# Patient Record
Sex: Female | Born: 1945
Health system: Southern US, Community
[De-identification: ages and names within clinical notes are randomized; demographics above are authoritative.]

## PROBLEM LIST (undated history)

## (undated) DIAGNOSIS — E785 Hyperlipidemia, unspecified: Secondary | ICD-10-CM

## (undated) DIAGNOSIS — K219 Gastro-esophageal reflux disease without esophagitis: Secondary | ICD-10-CM

## (undated) DIAGNOSIS — M1990 Unspecified osteoarthritis, unspecified site: Secondary | ICD-10-CM

## (undated) DIAGNOSIS — S83209A Unspecified tear of unspecified meniscus, current injury, unspecified knee, initial encounter: Secondary | ICD-10-CM

## (undated) DIAGNOSIS — R011 Cardiac murmur, unspecified: Secondary | ICD-10-CM

## (undated) DIAGNOSIS — I10 Essential (primary) hypertension: Secondary | ICD-10-CM

## (undated) DIAGNOSIS — K5792 Diverticulitis of intestine, part unspecified, without perforation or abscess without bleeding: Secondary | ICD-10-CM

## (undated) HISTORY — DX: Essential (primary) hypertension: I10

## (undated) HISTORY — DX: Gastro-esophageal reflux disease without esophagitis: K21.9

## (undated) HISTORY — DX: Diverticulitis of intestine, part unspecified, without perforation or abscess without bleeding: K57.92

## (undated) HISTORY — DX: Hyperlipidemia, unspecified: E78.5

---
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
## 2009-07-16 ENCOUNTER — Encounter: Admission: RE | Admit: 2009-07-16 | Discharge: 2009-07-16 | Payer: Self-pay | Admitting: Obstetrics and Gynecology

## 2009-08-15 ENCOUNTER — Ambulatory Visit (HOSPITAL_COMMUNITY): Admission: RE | Admit: 2009-08-15 | Discharge: 2009-08-15 | Payer: Self-pay | Admitting: Internal Medicine

## 2009-08-16 ENCOUNTER — Encounter: Payer: Self-pay | Admitting: Cardiovascular Disease

## 2010-06-15 ENCOUNTER — Encounter: Payer: Self-pay | Admitting: Obstetrics and Gynecology

## 2010-06-24 NOTE — Letter (Signed)
Summary: [facility] Office Note  [facility] Office Note   Imported By: Roderic Ovens 08/26/2009 15:57:51  _____________________________________________________________________  External Attachment:    Type:   Image     Comment:   External Document

## 2010-07-28 ENCOUNTER — Other Ambulatory Visit (HOSPITAL_COMMUNITY): Payer: Self-pay | Admitting: Obstetrics and Gynecology

## 2010-07-28 ENCOUNTER — Other Ambulatory Visit: Payer: Self-pay | Admitting: Obstetrics and Gynecology

## 2010-07-28 DIAGNOSIS — Z1231 Encounter for screening mammogram for malignant neoplasm of breast: Secondary | ICD-10-CM

## 2010-08-05 ENCOUNTER — Ambulatory Visit (HOSPITAL_COMMUNITY)
Admission: RE | Admit: 2010-08-05 | Discharge: 2010-08-05 | Disposition: A | Discharge status: Home / Self Care | Payer: Medicare HMO | Source: Ambulatory Visit | Attending: Obstetrics and Gynecology | Admitting: Obstetrics and Gynecology

## 2010-08-05 DIAGNOSIS — Z1231 Encounter for screening mammogram for malignant neoplasm of breast: Secondary | ICD-10-CM | POA: Insufficient documentation

## 2011-08-17 ENCOUNTER — Encounter: Payer: Self-pay | Admitting: Internal Medicine

## 2011-08-17 ENCOUNTER — Other Ambulatory Visit (HOSPITAL_COMMUNITY): Payer: Self-pay | Admitting: Obstetrics and Gynecology

## 2011-08-17 DIAGNOSIS — Z1231 Encounter for screening mammogram for malignant neoplasm of breast: Secondary | ICD-10-CM

## 2011-09-21 ENCOUNTER — Ambulatory Visit (AMBULATORY_SURGERY_CENTER): Payer: Medicare HMO | Admitting: *Deleted

## 2011-09-21 ENCOUNTER — Encounter: Payer: Self-pay | Admitting: Internal Medicine

## 2011-09-21 ENCOUNTER — Ambulatory Visit (HOSPITAL_COMMUNITY)
Admission: RE | Admit: 2011-09-21 | Discharge: 2011-09-21 | Disposition: A | Discharge status: Home / Self Care | Payer: Medicare HMO | Source: Ambulatory Visit | Attending: Obstetrics and Gynecology | Admitting: Obstetrics and Gynecology

## 2011-09-21 VITALS — Ht 67.0 in | Wt 142.0 lb

## 2011-09-21 DIAGNOSIS — Z1211 Encounter for screening for malignant neoplasm of colon: Secondary | ICD-10-CM

## 2011-09-21 DIAGNOSIS — Z1231 Encounter for screening mammogram for malignant neoplasm of breast: Secondary | ICD-10-CM | POA: Insufficient documentation

## 2011-09-21 MED ORDER — PEG-KCL-NACL-NASULF-NA ASC-C 100 G PO SOLR: ORAL | Status: DC

## 2011-10-07 ENCOUNTER — Encounter: Payer: Self-pay | Admitting: Internal Medicine

## 2011-10-07 ENCOUNTER — Ambulatory Visit (AMBULATORY_SURGERY_CENTER): Payer: Medicare HMO | Admitting: Internal Medicine

## 2011-10-07 VITALS — BP 131/57 | HR 68 | Temp 96.4°F | Resp 18 | Ht 67.0 in | Wt 142.0 lb

## 2011-10-07 DIAGNOSIS — Z1211 Encounter for screening for malignant neoplasm of colon: Secondary | ICD-10-CM

## 2011-10-07 DIAGNOSIS — Z860100 Personal history of colon polyps, unspecified: Secondary | ICD-10-CM

## 2011-10-07 DIAGNOSIS — K5730 Diverticulosis of large intestine without perforation or abscess without bleeding: Secondary | ICD-10-CM

## 2011-10-07 MED ORDER — SODIUM CHLORIDE 0.9 % IV SOLN: 500.0000 mL | INTRAVENOUS | Status: DC

## 2011-10-07 NOTE — Op Note (Signed)
[facility] [address] [place], [place]  [address]  COLONOSCOPY PROCEDURE REPORT  PATIENT:  Candice Richardson, Candice Richardson  MR#:  [id number] BIRTHDATE:  17-Apr-1946, 66 yrs. old  GENDER:  female ENDOSCOPIST:  Wilhemina Bonito. Eda Keys, MD REF. BY:  Ivery Quale, M.D. PROCEDURE DATE:  10/07/2011 PROCEDURE:  Surveillance Colonoscopy ASA CLASS:  Class II INDICATIONS:  history of polyps, family history of inflammatory bowel disease, family history of colon cancer, surveillance and high-risk screening ; previous exams in [place]. Last 5 yrs ago. now due for follow up MEDICATIONS:   MAC sedation, administered by CRNA, propofol (Diprivan) 300 mg IV  DESCRIPTION OF PROCEDURE:   After the risks benefits and alternatives of the procedure were thoroughly explained, informed consent was obtained.  Digital rectal exam was performed and revealed no abnormalities.   The LB CF-[id number] endoscope was introduced through the anus and advanced to the cecum, which was identified by both the appendix and ileocecal valve, without limitations.  The quality of the prep was excellent, using MoviPrep.  The instrument was then slowly withdrawn as the colon was fully examined. <<PROCEDUREIMAGES>>  FINDINGS:  Moderate diverticulosis was found throughout the colon. Otherwise normal colonoscopy without  polyps, masses, vascular ectasias, or inflammatory changes.   Retroflexed views in the rectum revealed no abnormalities.    The time to cecum =  4:48 minutes. The scope was then withdrawn in  11  minutes from the cecum and the procedure completed.  COMPLICATIONS:  None  ENDOSCOPIC IMPRESSION: 1) Moderate diverticulosis throughout the colon 2) Otherwise normal colonoscopy  RECOMMENDATIONS: 1) Follow up colonoscopy in 5 years  ______________________________ Wilhemina Bonito. Eda Keys, MD  CC:  Jarome Matin, MD;  The Patient  n. eSIGNED:   Wilhemina Bonito. Eda Keys at 10/07/2011 11:39 AM  Jeris Penta,  [id number]

## 2011-10-07 NOTE — Patient Instructions (Signed)

## 2011-10-07 NOTE — Progress Notes (Signed)
Patient did not experience any of the following events: a burn prior to discharge; a fall within the facility; wrong site/side/patient/procedure/implant event; or a hospital transfer or hospital admission upon discharge from the facility. (G8907) Patient did not have preoperative order for IV antibiotic SSI prophylaxis. (G8918)  

## 2011-10-08 ENCOUNTER — Telehealth: Payer: Self-pay | Admitting: *Deleted

## 2011-10-08 NOTE — Telephone Encounter (Signed)
  Follow up Call-  Call back number 10/07/2011  Post procedure Call Back phone  # [phone]  Permission to leave phone message Yes     Patient questions:  Do you have a fever, pain , or abdominal swelling? no Pain Score  0 *  Have you tolerated food without any problems? yes  Have you been able to return to your normal activities? yes  Do you have any questions about your discharge instructions: Diet   no Medications  no Follow up visit  no  Do you have questions or concerns about your Care? no  Actions: * If pain score is 4 or above: No action needed, pain <4.

## 2012-08-18 ENCOUNTER — Other Ambulatory Visit (HOSPITAL_COMMUNITY): Payer: Self-pay | Admitting: Obstetrics and Gynecology

## 2012-08-18 DIAGNOSIS — M810 Age-related osteoporosis without current pathological fracture: Secondary | ICD-10-CM

## 2012-08-18 DIAGNOSIS — Z1231 Encounter for screening mammogram for malignant neoplasm of breast: Secondary | ICD-10-CM

## 2012-09-26 ENCOUNTER — Ambulatory Visit (HOSPITAL_COMMUNITY): Payer: Medicare HMO

## 2012-10-04 ENCOUNTER — Ambulatory Visit (HOSPITAL_COMMUNITY)
Admission: RE | Admit: 2012-10-04 | Discharge: 2012-10-04 | Disposition: A | Discharge status: Home / Self Care | Payer: Medicare Other | Source: Ambulatory Visit | Attending: Obstetrics and Gynecology | Admitting: Obstetrics and Gynecology

## 2012-10-04 DIAGNOSIS — Z13820 Encounter for screening for osteoporosis: Secondary | ICD-10-CM | POA: Insufficient documentation

## 2012-10-04 DIAGNOSIS — Z1231 Encounter for screening mammogram for malignant neoplasm of breast: Secondary | ICD-10-CM

## 2012-10-04 DIAGNOSIS — Z780 Asymptomatic menopausal state: Secondary | ICD-10-CM | POA: Insufficient documentation

## 2012-10-04 DIAGNOSIS — M810 Age-related osteoporosis without current pathological fracture: Secondary | ICD-10-CM

## 2012-11-18 ENCOUNTER — Encounter (INDEPENDENT_AMBULATORY_CARE_PROVIDER_SITE_OTHER): Payer: Medicare Other | Admitting: *Deleted

## 2012-11-18 DIAGNOSIS — M79609 Pain in unspecified limb: Secondary | ICD-10-CM

## 2012-11-21 ENCOUNTER — Encounter: Payer: Self-pay | Admitting: Family Medicine

## 2013-10-04 ENCOUNTER — Ambulatory Visit (INDEPENDENT_AMBULATORY_CARE_PROVIDER_SITE_OTHER): Payer: Medicare Other | Admitting: Cardiovascular Disease

## 2013-10-04 ENCOUNTER — Encounter: Payer: Self-pay | Admitting: Cardiovascular Disease

## 2013-10-04 VITALS — BP 124/80 | HR 73 | Ht 67.0 in | Wt 150.7 lb

## 2013-10-04 DIAGNOSIS — K219 Gastro-esophageal reflux disease without esophagitis: Secondary | ICD-10-CM

## 2013-10-04 DIAGNOSIS — R0789 Other chest pain: Secondary | ICD-10-CM

## 2013-10-04 DIAGNOSIS — E785 Hyperlipidemia, unspecified: Secondary | ICD-10-CM

## 2013-10-04 DIAGNOSIS — I10 Essential (primary) hypertension: Secondary | ICD-10-CM

## 2013-10-04 DIAGNOSIS — R079 Chest pain, unspecified: Secondary | ICD-10-CM

## 2013-10-04 NOTE — Patient Instructions (Signed)
Your physician has requested that you have an echocardiogram. Echocardiography is a painless test that uses sound waves to create images of your heart. It provides your doctor with information about the size and shape of your heart and how well your heart's chambers and valves are working. This procedure takes approximately one hour. There are no restrictions for this procedure.  Your physician has requested that you have en exercise stress myoview. For further information please visit [email or web address]. Please follow instruction sheet, as given.  These tests will be scheduled the week of May 26th Your physician recommends that you schedule a follow-up appointment in: 6 weeks.  Your physician has recommended you make the following change in your medication: take some aleve for your symptoms.

## 2013-10-05 ENCOUNTER — Encounter: Payer: Self-pay | Admitting: Cardiovascular Disease

## 2013-10-05 DIAGNOSIS — E785 Hyperlipidemia, unspecified: Secondary | ICD-10-CM | POA: Insufficient documentation

## 2013-10-05 DIAGNOSIS — I10 Essential (primary) hypertension: Secondary | ICD-10-CM | POA: Insufficient documentation

## 2013-10-05 DIAGNOSIS — K219 Gastro-esophageal reflux disease without esophagitis: Secondary | ICD-10-CM | POA: Insufficient documentation

## 2013-10-05 DIAGNOSIS — R0789 Other chest pain: Secondary | ICD-10-CM | POA: Insufficient documentation

## 2013-10-05 NOTE — Progress Notes (Signed)
Patient ID: Candice Richardson, female   DOB: [birth year], 68 y.o.   MRN: [id number]     PATIENT PROFILE: Candice Richardson is a 68 year old female, who is referred by Dr. Bevelyn Buckles, for evaluation of left-sided chest pain prior to her planned travel.   HPI:  Candice Richardson has  a history of hypertension, GERD, as well as hyperlipidemia.  Previously when she lived in [place] she was told of having a heart murmur approximately 15 years ago and did undergo frequent echo Doppler studies, as well as stress tests , when she lived near [place].  He does exercise regularly and often exercises on the elliptical.  She has noticed some left sided chest pain, which he feels is more positional and muscular.  She had seen Dr. Sharlett Iles.  She describes the discomfort at times as a "throbbing ache."  Sometimes, sitting makes it worse.  She denies a clear-cut exertional precipitation.  She will be going to [place] in several days for a week to babysit her grandchildren and then will be home for 4 days prior to going on a 2 week trip to [place].  She presents now for cardiology evaluation.   She has been on amlodipine 5 mg and Lopressor 50 mg daily for blood pressure control.  She has a history of GERD which has benefited from omeprazole.  She has not had any subsequent echo Doppler studies or stress test since she moved to the [place] area from [place] several years ago.  Past Medical History  Diagnosis Date  . GERD (gastroesophageal reflux disease)   . Hyperlipidemia   . Hypertension     Past Surgical History  Procedure Laterality Date  . Appendectomy  1947    18 months old    No Known Allergies  Current Outpatient Prescriptions  Medication Sig Dispense Refill  . amLODipine (NORVASC) 5 MG tablet Take 5 mg by mouth daily.      Marland Kitchen aspirin 81 MG tablet Take 81 mg by mouth daily.      . calcium carbonate (OS-CAL) 600 MG TABS Take 600 mg by mouth 2 (two) times daily with a meal.      .  fish oil-omega-3 fatty acids 1000 MG capsule Take 1 g by mouth daily.      . metoprolol (LOPRESSOR) 50 MG tablet Take 50 mg by mouth daily.      . Multiple Vitamins-Minerals (MULTIVITAMIN WITH MINERALS) tablet Take 1 tablet by mouth daily.      Marland Kitchen omeprazole (PRILOSEC) 20 MG capsule Take 20 mg by mouth daily.      . peg 3350 powder (MOVIPREP) SOLR MOVI PREP take as directed  1 kit  0  . PREMARIN vaginal cream Place 1 application vaginally Once a week.      . simvastatin (ZOCOR) 40 MG tablet Take 40 mg by mouth every evening.       No current facility-administered medications for this visit.    Socially, she was originally from [place].  She lives in [place] for approximately 10-15 years.  She is widowed.  She does travel.  She has 2 children and 4 grandchildren.  She does drink occasional wine.  There is no tobacco use  Family History  Problem Relation Age of Onset  . Heart disease Mother   . Stomach cancer Father   . Colon polyps Brother   . Colon cancer Cousin     ROS General: Negative; No fevers, chills, or night sweats HEENT: Negative; No changes  in vision or hearing, sinus congestion, difficulty swallowing Pulmonary: Negative; No cough, wheezing, shortness of breath, hemoptysis Cardiovascular: Positive for left-sided chest pain, no presyncope, syncope, palpatations GI: Positive for GERD No nausea, vomiting, diarrhea, or abdominal pain GU: Negative; No dysuria, hematuria, or difficulty voiding Musculoskeletal: Negative; no myalgias, joint pain, or weakness Hematologic/Oncologic: Negative; no easy bruising, bleeding Endocrine: Negative; no heat/cold intolerance; no diabetes Neuro: Negative; no changes in balance, headaches Skin: Negative; No rashes or skin lesions Psychiatric: Negative; No behavioral problems, depression Sleep: Negative; No daytime sleepiness, hypersomnolence, bruxism, restless legs, hypnogagnic hallucinations Other comprehensive 14 point system  review is negative  Physical Exam BP 124/80  Pulse 73  Ht $R'5\' 7"'Vx$  (1.702 m)  Wt 150 lb 11.2 oz (68.357 kg)  BMI 23.60 kg/m2 General: Alert, oriented, no distress.  Skin: normal turgor, no rashes, warm and dry HEENT: Normocephalic, atraumatic. Pupils equal round and reactive to light; sclera anicteric; extraocular muscles intact; Fundi no AV nicking, hemorrhages, or exudates. Nose without nasal septal hypertrophy Mouth/Parynx benign; Mallinpatti scale 2 Neck: No JVD, no carotid bruits; normal carotid upstroke Lungs: clear to ausculatation and percussion; no wheezing or rales Chest wall: mild tenderness to palpitation over the left costochondral region Heart: PMI not displaced, RRR, s1 s2 normal, 1/6 systolic murmur, no diastolic murmur, no rubs, gallops, thrills, or heaves Abdomen: soft, nontender; no hepatosplenomehaly, BS+; abdominal aorta nontender and not dilated by palpation. Back: no CVA tenderness Pulses 2+ Musculoskeletal: full range of motion, normal strength, no joint deformities Extremities: no clubbing cyanosis or edema, Homan's sign negative  Neurologic: grossly nonfocal; Cranial nerves grossly wnl Psychologic: Normal mood and affect   ECG (independently read by me): Normal sinus rhythm at 73 beats per minute.  No ectopy.  Normal intervals.  LABS:  BMET No results found for this basename: na, k, cl, co2, glucose, bun, creatinine, calcium, gfrnonaa, gfraa     Hepatic Function Panel  No results found for this basename: prot, albumin, ast, alt, alkphos, bilitot, bilidir, ibili     CBC No results found for this basename: wbc, rbc, hgb, hct, plt, mcv, mch, mchc, rdw, neutrabs, lymphsabs, monoabs, eosabs, basosabs     BNP No results found for this basename: probnp    Lipid Panel  No results found for this basename: chol, trig, hdl, cholhdl, vldl, ldlcalc      RADIOLOGY: No results found.   ASSESSMENT AND PLAN: Candice Richardson is a 68 year-old female who has  a history of hypertension, hyperlipidemia, with cholesterols as high as 240 in the past, as well as GERD.  She has been taking simvastatin 40 mg for her lipid abnormality.  She has been taking amlodipine 5 mg and metoprolol 50 mg for her blood pressure.  Presently, her blood pressure is stable.  She has experienced some left-sided chest discomfort.  This is not exertionally precipitated.  Recently she tried doing elliptical exercises to see if it made it worse.  She does have chest wall tenderness to palpation.  Her ECG is unremarkable.  I suspect this most likely is musculoskeletal chest discomfort, and I suggested that she take over-the-counter Aleve 2 tablets twice a day for the next 3-4 days and then one twice a day with food and that she will continue to take her omeprazole for GERD.  Since she was told remotely of having a leaky heart valve I am scheduling her for a followup echo Doppler study.  I will also schedule her for routine treadmill test for assurance to  make certain there is no significant exercise-induced EKG changes.  I'll see her back in followup and further recommendations will be made at that time.    Troy Sine, MD, [facility] 10/05/2013 5:50 PM

## 2013-10-06 ENCOUNTER — Other Ambulatory Visit: Payer: Self-pay | Admitting: *Deleted

## 2013-10-06 DIAGNOSIS — R079 Chest pain, unspecified: Secondary | ICD-10-CM

## 2013-10-09 ENCOUNTER — Telehealth (HOSPITAL_COMMUNITY): Payer: Self-pay | Admitting: *Deleted

## 2013-10-13 ENCOUNTER — Telehealth (HOSPITAL_COMMUNITY): Payer: Self-pay | Admitting: *Deleted

## 2013-10-17 ENCOUNTER — Telehealth (HOSPITAL_COMMUNITY): Payer: Self-pay

## 2013-10-18 ENCOUNTER — Ambulatory Visit (HOSPITAL_COMMUNITY)
Admission: RE | Admit: 2013-10-18 | Discharge: 2013-10-18 | Disposition: A | Discharge status: Home / Self Care | Payer: Medicare Other | Source: Ambulatory Visit | Attending: Internal Medicine | Admitting: Internal Medicine

## 2013-10-18 DIAGNOSIS — R079 Chest pain, unspecified: Secondary | ICD-10-CM | POA: Insufficient documentation

## 2013-10-19 ENCOUNTER — Encounter (HOSPITAL_COMMUNITY): Payer: Medicare Other

## 2013-10-19 ENCOUNTER — Ambulatory Visit (HOSPITAL_COMMUNITY)
Admission: RE | Admit: 2013-10-19 | Discharge: 2013-10-19 | Disposition: A | Discharge status: Home / Self Care | Payer: Medicare Other | Source: Ambulatory Visit | Attending: Cardiovascular Disease | Admitting: Cardiovascular Disease

## 2013-10-19 DIAGNOSIS — R079 Chest pain, unspecified: Secondary | ICD-10-CM | POA: Insufficient documentation

## 2013-10-19 DIAGNOSIS — I059 Rheumatic mitral valve disease, unspecified: Secondary | ICD-10-CM

## 2013-10-19 NOTE — Progress Notes (Signed)
2D Echo Performed 10/19/2013    Sible Straley, RCS  

## 2013-10-24 ENCOUNTER — Encounter: Payer: Self-pay | Admitting: *Deleted

## 2013-10-30 ENCOUNTER — Telehealth: Payer: Self-pay | Admitting: *Deleted

## 2013-10-30 ENCOUNTER — Other Ambulatory Visit: Payer: Self-pay | Admitting: *Deleted

## 2013-10-30 DIAGNOSIS — I10 Essential (primary) hypertension: Secondary | ICD-10-CM

## 2013-10-30 DIAGNOSIS — R9439 Abnormal result of other cardiovascular function study: Secondary | ICD-10-CM

## 2013-10-31 NOTE — Telephone Encounter (Signed)
Left message to return call in reference to her stress test.

## 2013-11-09 ENCOUNTER — Telehealth: Payer: Self-pay | Admitting: Cardiovascular Disease

## 2013-11-09 NOTE — Telephone Encounter (Signed)
Returning your call from 10-30-13.

## 2013-11-10 NOTE — Telephone Encounter (Signed)
Returned patient's call. Discussed her stress test.

## 2013-11-13 ENCOUNTER — Other Ambulatory Visit: Payer: Self-pay | Admitting: *Deleted

## 2013-11-13 DIAGNOSIS — I10 Essential (primary) hypertension: Secondary | ICD-10-CM

## 2013-11-13 DIAGNOSIS — R9439 Abnormal result of other cardiovascular function study: Secondary | ICD-10-CM

## 2013-11-14 ENCOUNTER — Other Ambulatory Visit (HOSPITAL_COMMUNITY): Payer: Self-pay | Admitting: Cardiovascular Disease

## 2013-11-14 ENCOUNTER — Ambulatory Visit (HOSPITAL_COMMUNITY)
Admission: RE | Admit: 2013-11-14 | Discharge: 2013-11-14 | Disposition: A | Discharge status: Home / Self Care | Payer: Medicare Other | Source: Ambulatory Visit | Attending: Internal Medicine | Admitting: Internal Medicine

## 2013-11-14 DIAGNOSIS — R079 Chest pain, unspecified: Secondary | ICD-10-CM | POA: Insufficient documentation

## 2013-11-14 DIAGNOSIS — Z8249 Family history of ischemic heart disease and other diseases of the circulatory system: Secondary | ICD-10-CM | POA: Insufficient documentation

## 2013-11-14 DIAGNOSIS — R0609 Other forms of dyspnea: Secondary | ICD-10-CM | POA: Insufficient documentation

## 2013-11-14 DIAGNOSIS — R002 Palpitations: Secondary | ICD-10-CM | POA: Insufficient documentation

## 2013-11-14 DIAGNOSIS — I10 Essential (primary) hypertension: Secondary | ICD-10-CM | POA: Insufficient documentation

## 2013-11-14 DIAGNOSIS — R0789 Other chest pain: Secondary | ICD-10-CM

## 2013-11-14 DIAGNOSIS — R0989 Other specified symptoms and signs involving the circulatory and respiratory systems: Secondary | ICD-10-CM | POA: Insufficient documentation

## 2013-11-14 MED ORDER — REGADENOSON 0.4 MG/5ML IV SOLN
0.4000 mg | Freq: Once | INTRAVENOUS | Status: AC
  Administered 2013-11-14: 0.4 mg via INTRAVENOUS

## 2013-11-14 MED ORDER — TECHNETIUM TC 99M SESTAMIBI GENERIC - CARDIOLITE
10.0000 | Freq: Once | INTRAVENOUS | Status: AC | PRN
  Administered 2013-11-14: 10 via INTRAVENOUS

## 2013-11-14 MED ORDER — TECHNETIUM TC 99M SESTAMIBI GENERIC - CARDIOLITE
30.0000 | Freq: Once | INTRAVENOUS | Status: AC | PRN
  Administered 2013-11-14: 30 via INTRAVENOUS

## 2013-11-14 NOTE — Procedures (Addendum)
[facility] [address] [place] [address] [phone]  Cardiology Nuclear Med Study  Candice Richardson is a 68 y.o. female     MRN : [id number]     DOB: 1945-06-03  Procedure Date: 11/14/2013  Nuclear Med Background Indication for Stress Test:  Evaluation for Ischemia History:  murmur;No prior respiratory history reported;No prior NUC MPI for comparison;ECHO-10/18/2013-ischemic w/ST changes Cardiac Risk Factors: Family History - CAD, Hypertension and Lipids  Symptoms:  Chest Pain and DOE   Nuclear Pre-Procedure Caffeine/Decaff Intake:  9:00pm NPO After: 7:00am   IV Site: R Forearm  IV 0.9% NS with Angio Cath:  22g  Chest Size (in):  n/a IV Started by: Candice OgrenAmanda Wease, RN  Height: 5\' 7"  (1.702 m)  Cup Size: A  BMI:  Body mass index is 23.49 kg/(m^2). Weight:  150 lb (68.04 kg)   Tech Comments:  n/a    Nuclear Med Study 1 or 2 day study: 1 day  Stress Test Type:  Stress  Order Authorizing Provider:  Nicki Guadalajarahomas [name], MD   Resting Radionuclide: Technetium 5752m Sestamibi  Resting Radionuclide Dose: 10.8 mCi   Stress Radionuclide:  Technetium 6252m Sestamibi  Stress Radionuclide Dose: 30.6 mCi           Stress Protocol Rest HR: 60 Stress HR: 78  Rest BP: 149/84 Stress BP: 152/86  Exercise Time (min): n/a METS: n/a   Predicted Max HR: 150 bpm % Max HR: 56.67 bpm Rate Pressure Product: [id number]  Dose of Adenosine (mg):  n/a Dose of Lexiscan: 0.4 mg  Dose of Atropine (mg): n/a Dose of Dobutamine: n/a mcg/kg/min (at max HR)  Stress Test Technologist: Esperanza Sheetserry-Marie Martin, CCT Nuclear Technologist: Koren Shiverobin Moffitt, CNMT   Rest Procedure:  Myocardial perfusion imaging was performed at rest 45 minutes following the intravenous administration of Technetium 3952m Sestamibi. Stress Procedure:  The patient received IV Lexiscan 0.4 mg over 15-seconds.  Technetium 7652m Sestamibi injected IV at 30-seconds.  There were no significant changes with  Lexiscan.  Quantitative spect images were obtained after a 45 minute delay.  Transient Ischemic Dilatation (Normal <1.22):  1.27 QGS EDV:  60 ml QGS ESV:  18 ml LV Ejection Fraction: 69%     Rest ECG: NSR - Normal EKG  Stress ECG: No significant change from baseline ECG  QPS Raw Data Images:  There is interference from nuclear activity from structures below the diaphragm. This does not affect the ability to read the study. Stress Images:  Normal homogeneous uptake in all areas of the myocardium. Rest Images:  Normal homogeneous uptake in all areas of the myocardium. Subtraction (SDS):  Normal  Impression Exercise Capacity:  Lexiscan with no exercise. BP Response:  Normal blood pressure response. Clinical Symptoms:  No significant symptoms noted. ECG Impression:  No significant ST segment change suggestive of ischemia. Comparison with Prior Nuclear Study: No previous nuclear study performed  Overall Impression:  Normal stress nuclear study.  LV Wall Motion:  NL LV Function, EF 69%; NL Wall Motion   Nicki GuadalajaraKELLY,THOMAS A, MD  11/14/2013 12:51 PM

## 2013-11-15 ENCOUNTER — Telehealth: Payer: Self-pay | Admitting: Cardiovascular Disease

## 2013-11-15 NOTE — Telephone Encounter (Signed)
Follow Up    Pt is calling following up test results from yesterday. Please call.

## 2013-11-15 NOTE — Telephone Encounter (Signed)
RN called and informed that preliminary report is that nuclear stress test was "normal" per overall impressions from test result in EPIC. Patient wishes to be called with final result prior to going out of town on Friday June 26

## 2013-11-16 NOTE — Telephone Encounter (Signed)
Patient notified per Nuclear Stress test - NORMAL.  Patient voiced understanding.

## 2013-11-16 NOTE — Telephone Encounter (Signed)
Pt called again today,she wants her stress test results from Tuesday.She would like her results before she goes out of town tomorrow please.

## 2013-11-22 NOTE — Telephone Encounter (Signed)
Encounter complete. 

## 2013-12-05 ENCOUNTER — Other Ambulatory Visit (HOSPITAL_COMMUNITY): Payer: Self-pay | Admitting: Internal Medicine

## 2013-12-05 DIAGNOSIS — Z1231 Encounter for screening mammogram for malignant neoplasm of breast: Secondary | ICD-10-CM

## 2013-12-11 ENCOUNTER — Encounter: Payer: Self-pay | Admitting: Cardiovascular Disease

## 2013-12-11 ENCOUNTER — Ambulatory Visit (INDEPENDENT_AMBULATORY_CARE_PROVIDER_SITE_OTHER): Payer: Medicare Other | Admitting: Cardiovascular Disease

## 2013-12-11 ENCOUNTER — Other Ambulatory Visit (HOSPITAL_COMMUNITY): Payer: Self-pay | Admitting: *Deleted

## 2013-12-11 VITALS — BP 146/78 | HR 62 | Ht 67.0 in | Wt 150.1 lb

## 2013-12-11 DIAGNOSIS — I340 Nonrheumatic mitral (valve) insufficiency: Secondary | ICD-10-CM

## 2013-12-11 DIAGNOSIS — I059 Rheumatic mitral valve disease, unspecified: Secondary | ICD-10-CM

## 2013-12-11 DIAGNOSIS — R0789 Other chest pain: Secondary | ICD-10-CM

## 2013-12-11 DIAGNOSIS — E785 Hyperlipidemia, unspecified: Secondary | ICD-10-CM

## 2013-12-11 DIAGNOSIS — I10 Essential (primary) hypertension: Secondary | ICD-10-CM

## 2013-12-11 DIAGNOSIS — K219 Gastro-esophageal reflux disease without esophagitis: Secondary | ICD-10-CM

## 2013-12-11 NOTE — Patient Instructions (Signed)
If you find your systolic (top number) is greater than 140 consistently you can increase the Amlodipine to 7.5mg  daily.  Your physician recommends that you schedule a follow-up appointment in: One year.

## 2013-12-17 ENCOUNTER — Encounter: Payer: Self-pay | Admitting: Cardiovascular Disease

## 2013-12-17 DIAGNOSIS — I340 Nonrheumatic mitral (valve) insufficiency: Secondary | ICD-10-CM | POA: Insufficient documentation

## 2013-12-17 NOTE — Progress Notes (Signed)
Patient ID: Candice Richardson, female   DOB: 1945-12-04, 68 y.o.   MRN: [id number]     HPI:  Candice Richardson is a 68 year old female, who I saw 2 months ago referred by Dr. Bevelyn Buckles, for evaluation of left-sided chest pain.   Candice Richardson has  a history of hypertension, GERD, as well as hyperlipidemia.  Previously when she lived in [place] she was told of having a heart murmur approximately 15 years ago and underwent frequent echo Doppler studies, as well as stress tests , when she lived near [place].  She does exercise regularly She has noticed some left sided chest pain, which he feels is more positional and muscular.  She had seen Dr. Sharlett Iles.  She described the discomfort at times as a "throbbing ache."  Sometimes, sitting makes it worse.  She denied a clear-cut exertional precipitation.  I saw her prior to her planned travel.  She has been on amlodipine 5 mg and Lopressor 50 mg daily for blood pressure control.  She has a history of GERD which has benefited from omeprazole.  She had not had any subsequent echo Doppler studies or stress test since she moved to the [place] area from [place] several years ago.  When I saw her, I did recommend over-the-counter Aleve 4 mg twice a day for 3-4 days and then one tablet twice a day.  This resolved her probable musculoskeletal chest pain.  She did undergo a routine treadmill test and reportedly had approximately 2 mm of horizontal ST depression that was completely asymptomatic at peak stress achieving a workload of 11 mets maximal heart rate of 136 beats per minute, representing 90% of predicted maximum.  Subsequently, she underwent a nuclear myocardial perfusion was entirely normal and showed normal LV function, normal wall motion and a post-rest ejection fraction of 69%.  There is normal.  Perfusion to all regions and no significant ECG changes were noted.uclear perfusion study, which was done on 11/14/2013.  An echo Doppler study  revealed an ejection fraction of 55-60% without wall motion abnormalities.  She had normal diastolic parameters. Her mitral valve was mildly thickened with mild regurgitation and there was mild TR.  There was mild pulmonary hypertension with estimated PA pressure 37.  Presently, she denies recurrent chest pain.  She denies shortness of breath.  She was concerned whether or not she should continue to take aspirin.  Past Medical History  Diagnosis Date  . GERD (gastroesophageal reflux disease)   . Hyperlipidemia   . Hypertension     Past Surgical History  Procedure Laterality Date  . Appendectomy  1947    18 months old    No Known Allergies  Current Outpatient Prescriptions  Medication Sig Dispense Refill  . amLODipine (NORVASC) 5 MG tablet Take 5 mg by mouth daily.      Marland Kitchen aspirin 81 MG tablet Take 81 mg by mouth daily.      . calcium carbonate (OS-CAL) 600 MG TABS Take 600 mg by mouth 2 (two) times daily with a meal.      . fish oil-omega-3 fatty acids 1000 MG capsule Take 1 g by mouth daily.      . metoprolol (LOPRESSOR) 50 MG tablet Take 50 mg by mouth daily.      . Multiple Vitamins-Minerals (MULTIVITAMIN WITH MINERALS) tablet Take 1 tablet by mouth daily.      Marland Kitchen omeprazole (PRILOSEC) 20 MG capsule Take 20 mg by mouth daily.      . peg  3350 powder (MOVIPREP) SOLR MOVI PREP take as directed  1 kit  0  . PREMARIN vaginal cream Place 1 application vaginally Once a week.      . simvastatin (ZOCOR) 40 MG tablet Take 40 mg by mouth every evening.       No current facility-administered medications for this visit.    Socially, she was originally from [place].  She lives in [place] for approximately 10-15 years.  She is widowed.  She does travel.  She has 2 children and 4 grandchildren.  She does drink occasional wine.  There is no tobacco use  Family History  Problem Relation Age of Onset  . Heart disease Mother   . Stomach cancer Father   . Colon polyps Brother   .  Colon cancer Cousin     ROS General: Negative; No fevers, chills, or night sweats HEENT: Negative; No changes in vision or hearing, sinus congestion, difficulty swallowing Pulmonary: Negative; No cough, wheezing, shortness of breath, hemoptysis Cardiovascular: Positive for left-sided chest pain, no presyncope, syncope, palpatations GI: Positive for GERD No nausea, vomiting, diarrhea, or abdominal pain GU: Negative; No dysuria, hematuria, or difficulty voiding Musculoskeletal: Negative; no myalgias, joint pain, or weakness Hematologic/Oncologic: Negative; no easy bruising, bleeding Endocrine: Negative; no heat/cold intolerance; no diabetes Neuro: Negative; no changes in balance, headaches Skin: Negative; No rashes or skin lesions Psychiatric: Negative; No behavioral problems, depression Sleep: Negative; No daytime sleepiness, hypersomnolence, bruxism, restless legs, hypnogagnic hallucinations Other comprehensive 14 point system review is negative  Physical Exam BP 146/78  Pulse 62  Ht $R'5\' 7"'TJ$  (1.702 m)  Wt 150 lb 1.6 oz (68.085 kg)  BMI 23.50 kg/m2 General: Alert, oriented, no distress.  Skin: normal turgor, no rashes, warm and dry HEENT: Normocephalic, atraumatic. Pupils equal round and reactive to light; sclera anicteric; extraocular muscles intact; Fundi no AV nicking, hemorrhages, or exudates. Nose without nasal septal hypertrophy Mouth/Parynx benign; Mallinpatti scale 2 Neck: No JVD, no carotid bruits; normal carotid upstroke Lungs: clear to ausculatation and percussion; no wheezing or rales Chest wall: Resolution of prior tenderness to palpitation over the left costochondral region Heart: PMI not displaced, RRR, s1 s2 normal, 1/6 systolic murmur, no diastolic murmur, no rubs, gallops, thrills, or heaves Abdomen: soft, nontender; no hepatosplenomehaly, BS+; abdominal aorta nontender and not dilated by palpation. Back: no CVA tenderness Pulses 2+ Musculoskeletal: full range of  motion, normal strength, no joint deformities Extremities: no clubbing cyanosis or edema, Homan's sign negative  Neurologic: grossly nonfocal; Cranial nerves grossly wnl Psychologic: Normal mood and affect   Prior ECG (independently read by me): Normal sinus rhythm at 73 beats per minute.  No ectopy.  Normal intervals.  LABS:  BMET No results found for this basename: na,  k,  cl,  co2,  glucose,  bun,  creatinine,  calcium,  gfrnonaa,  gfraa     Hepatic Function Panel  No results found for this basename: prot,  albumin,  ast,  alt,  alkphos,  bilitot,  bilidir,  ibili     CBC No results found for this basename: wbc,  rbc,  hgb,  hct,  plt,  mcv,  mch,  mchc,  rdw,  neutrabs,  lymphsabs,  monoabs,  eosabs,  basosabs     BNP No results found for this basename: probnp    Lipid Panel  No results found for this basename: chol,  trig,  hdl,  cholhdl,  vldl,  ldlcalc      RADIOLOGY: No results found.  ASSESSMENT AND PLAN: Ms. Beske is a 68 year-old female who has a history of hypertension, hyperlipidemia, with cholesterols as high as 240 in the past, as well as GERD.  She has been taking simvastatin 40 mg for her lipid abnormality.  She has been taking amlodipine 5 mg and metoprolol 50 mg for her blood pressure. I suspect her previous chest pain was musculoskeletal in etiology and this completely resolved with Aleve.  I reviewed her most recent echo study, routine treadmill test, and subsequent nuclear perfusion study. Her nuclear perfusion study shows normal perfusion and function.  She has mildly thickened mitral valve leaflets with mild mitral regurgitation and mild tricuspid regurgitation.  There was evidence for mild increased PA pressure 37 mm Her blood pressure today was slightly elevated at 148/70 when rechecked by me.  She has stopped taking her aspirin.  I suggested that she at least take a baby aspirin every other day if she is able to tolerate this.  I've also  recommended that she follow her blood pressure and if her systolic blood pressure is consistently 140 or greater she should titrate her amlodipine to 7.5 mg.  Candice Sine, MD, [facility] 12/17/2013 3:47 PM

## 2014-01-02 ENCOUNTER — Ambulatory Visit (HOSPITAL_COMMUNITY)
Admission: RE | Admit: 2014-01-02 | Discharge: 2014-01-02 | Disposition: A | Discharge status: Home / Self Care | Payer: Medicare Other | Source: Ambulatory Visit | Attending: Internal Medicine | Admitting: Internal Medicine

## 2014-01-02 DIAGNOSIS — Z1231 Encounter for screening mammogram for malignant neoplasm of breast: Secondary | ICD-10-CM | POA: Insufficient documentation

## 2014-12-18 ENCOUNTER — Other Ambulatory Visit (HOSPITAL_COMMUNITY): Payer: Self-pay | Admitting: Internal Medicine

## 2014-12-18 DIAGNOSIS — Z1231 Encounter for screening mammogram for malignant neoplasm of breast: Secondary | ICD-10-CM

## 2015-01-02 ENCOUNTER — Ambulatory Visit (INDEPENDENT_AMBULATORY_CARE_PROVIDER_SITE_OTHER): Payer: Medicare Other | Admitting: Cardiovascular Disease

## 2015-01-02 ENCOUNTER — Encounter: Payer: Self-pay | Admitting: Cardiovascular Disease

## 2015-01-02 VITALS — BP 158/82 | HR 60 | Ht 67.0 in | Wt 148.3 lb

## 2015-01-02 DIAGNOSIS — I10 Essential (primary) hypertension: Secondary | ICD-10-CM

## 2015-01-02 DIAGNOSIS — E785 Hyperlipidemia, unspecified: Secondary | ICD-10-CM

## 2015-01-02 DIAGNOSIS — K219 Gastro-esophageal reflux disease without esophagitis: Secondary | ICD-10-CM

## 2015-01-02 DIAGNOSIS — Z79899 Other long term (current) drug therapy: Secondary | ICD-10-CM | POA: Diagnosis not present

## 2015-01-02 DIAGNOSIS — I340 Nonrheumatic mitral (valve) insufficiency: Secondary | ICD-10-CM

## 2015-01-02 DIAGNOSIS — R0789 Other chest pain: Secondary | ICD-10-CM

## 2015-01-02 NOTE — Patient Instructions (Addendum)
Your physician recommends that you return for lab work fasting to be done by your primary care MD.  Your physician wants you to follow-up in: 1 year or sooner if needed. You will receive a reminder letter in the mail two months in advance. If you don't receive a letter, please call our office to schedule the follow-up appointment.

## 2015-01-04 ENCOUNTER — Encounter: Payer: Self-pay | Admitting: Cardiovascular Disease

## 2015-01-04 NOTE — Progress Notes (Signed)
Patient ID: Candice Richardson, female   DOB: 24-Nov-1945, 69 y.o.   MRN: [id number]     HPI:  Candice Richardson is a 69 year old female who presents for one-year follow-up cardiology evaluation.  Candice Richardson has  a history of hypertension, GERD, as well as hyperlipidemia.  She was told of having a heart murmur approximately 15 years ago when living in [place] and underwent frequent echo Doppler studies, as well as stress tests.  She does exercise regularly.  I first saw her last year when she was referred to me by Dr. Jarold Motto after she has noticed some left sided chest pain, which he feels is more positional and muscular.   She described the discomfort at times as a "throbbing ache."  Sometimes, sitting makes it worse.  She denied a clear-cut exertional precipitation.  I saw her prior to her planned travel and was on amlodipine 5 mg and Lopressor 50 mg daily for blood pressure control.  She has a history of GERD which has benefited from omeprazole.  She had not had any subsequent echo Doppler studies or stress test since she moved to the [place] area from [place] several years ago.  When I saw her, I recommend over-the-counter Aleve 4 mg twice a day for 3-4 days and then one tablet twice a day.  This resolved her probable musculoskeletal chest pain.  She underwent  a routine treadmill test and reportedly had approximately 2 mm of horizontal ST depression that was completely asymptomatic at peak stress achieving a workload of 11 mets maximal heart rate of 136 beats per minute, representing 90% of predicted maximum.  Subsequently, she a nuclear myocardial perfusion was entirely normal and showed normal LV function, normal wall motion and a post-rest ejection fraction of 69%.  Perfusion was normal to all regions and no significant ECG changes.developed. An echo Doppler study revealed an ejection fraction of 55-60% without wall motion abnormalities.  She had normal diastolic parameters. Her mitral  valve was mildly thickened with mild regurgitation and there was mild TR.  There was mild pulmonary hypertension with estimated PA pressure 37.  Over the past year, she has continued to be active.  She has had issues with left knee discomfort and also issues with her right thumb.  She uses a fit bit and typically walks 4000-5000 steps per day.  She has been taking her aspirin every other day.  She denies chest pain.  She denies PND, orthopnea.  She denies presyncope or syncope.  Past Medical History  Diagnosis Date  . GERD (gastroesophageal reflux disease)   . Hyperlipidemia   . Hypertension     Past Surgical History  Procedure Laterality Date  . Appendectomy  1947    18 months old    No Known Allergies  Current Outpatient Prescriptions  Medication Sig Dispense Refill  . amLODipine (NORVASC) 5 MG tablet Take 5 mg by mouth daily.    Marland Kitchen aspirin 81 MG tablet Take 81 mg by mouth daily.    . calcium carbonate (OS-CAL) 600 MG TABS Take 600 mg by mouth 2 (two) times daily with a meal.    . metoprolol (LOPRESSOR) 50 MG tablet Take 50 mg by mouth daily.    . Multiple Vitamins-Minerals (MULTIVITAMIN WITH MINERALS) tablet Take 1 tablet by mouth daily.    Marland Kitchen omeprazole (PRILOSEC) 20 MG capsule Take 20 mg by mouth daily.    Marland Kitchen PREMARIN vaginal cream Place 1 application vaginally Once a week.    . simvastatin (  ZOCOR) 40 MG tablet Take 20 mg by mouth every evening.      No current facility-administered medications for this visit.    Socially, she was originally from [place].  She lives in [place] for approximately 10-15 years.  She is widowed.  She does travel.  She has 2 children and 4 grandchildren.  She does drink occasional wine.  There is no tobacco use  Family History  Problem Relation Age of Onset  . Heart disease Mother   . Stomach cancer Father   . Colon polyps Brother   . Colon cancer Cousin     ROS General: Negative; No fevers, chills, or night sweats HEENT:  Negative; No changes in vision or hearing, sinus congestion, difficulty swallowing Pulmonary: Negative; No cough, wheezing, shortness of breath, hemoptysis Cardiovascular: Positive for left-sided chest pain, no presyncope, syncope, palpatations GI: Positive for GERD No nausea, vomiting, diarrhea, or abdominal pain GU: Negative; No dysuria, hematuria, or difficulty voiding Musculoskeletal: Negative; no myalgias, joint pain, or weakness Hematologic/Oncologic: Negative; no easy bruising, bleeding Endocrine: Negative; no heat/cold intolerance; no diabetes Neuro: Negative; no changes in balance, headaches Skin: Negative; No rashes or skin lesions Psychiatric: Negative; No behavioral problems, depression Sleep: Negative; No daytime sleepiness, hypersomnolence, bruxism, restless legs, hypnogagnic hallucinations Other comprehensive 14 point system review is negative  Physical Exam BP 158/82 mmHg  Pulse 60  Ht  (1.702 m)  Wt 148 lb 5 oz (67.274 kg)  BMI 23.22 kg/m2   Wt Readings from Last 3 Encounters:  01/02/15 148 lb 5 oz (67.274 kg)  12/11/13 150 lb 1.6 oz (68.085 kg)  11/14/13 150 lb (68.04 kg)   General: Alert, oriented, no distress.  Skin: normal turgor, no rashes, warm and dry HEENT: Normocephalic, atraumatic. Pupils equal round and reactive to light; sclera anicteric; extraocular muscles intact; Fundi no AV nicking, hemorrhages, or exudates. Nose without nasal septal hypertrophy Mouth/Parynx benign; Mallinpatti scale 2 Neck: No JVD, no carotid bruits; normal carotid upstroke Lungs: clear to ausculatation and percussion; no wheezing or rales Chest wall: Resolution of prior tenderness to palpitation over the left costochondral region Heart: PMI not displaced, RRR, s1 s2 normal, 1/6 systolic murmur, no diastolic murmur, no rubs, gallops, thrills, or heaves Abdomen: soft, nontender; no hepatosplenomehaly, BS+; abdominal aorta nontender and not dilated by palpation. Back: no CVA  tenderness Pulses 2+ Musculoskeletal: full range of motion, normal strength, no joint deformities Extremities: no clubbing cyanosis or edema, Homan's sign negative  Neurologic: grossly nonfocal; Cranial nerves grossly wnl Psychologic: Normal mood and affect  ECG (independently read by me): Normal sinus rhythm at 60 bpm.  No ectopy.  Normal intervals.   May 2015  ECG (independently read by me): Normal sinus rhythm at 73 beats per minute.  No ectopy.  Normal intervals.  LABS:  BMET No results found for: NA   Hepatic Function Panel  No results found for: PROT   CBC No results found for: WBC   BNP No results found for: PROBNP  Lipid Panel  No results found for: CHOL    RADIOLOGY: No results found.   ASSESSMENT AND PLAN: Candice Richardson is a 69 year-old female who has a history of hypertension, hyperlipidemia, with cholesterols as high as 240 in the past, as well as GERD.  She has been taking simvastatin 40 mg for her lipid abnormality.  She has been taking amlodipine 5 mg and metoprolol 50 mg for her blood pressure.  I suspect her chest pain from one year ago  was musculoskeletal in etiology.  She states her blood pressure typically is controlled and runs in the range of 110-120 systolically at home.  Her blood pressure was mildly increased today in the office.  Her resting pulse is 60.  On her current dose of metoprolol.  She also takes amlodipine.  She continues to be on simvastatin 40 mg.  I again discussed with her current recommendations that simvastatin should only be given up to 20 mg with concomitant amlodipine treatment.  Dr. Jarold Motto has been following up her laboratory.  I have suggested she reduce her simvastatin down to 20 mg with plans for follow-up blood work to be done by Dr. Jarold Motto in 2-3 months.  If her lipid studies are elevated I would recommending changing her to atorvastatin or rosuvastatin which do not have dose limitations with concomitant amlodipine therapy.   She is exercising regularly and will be trying to increase this further, but this has been somewhat reduced secondary to her left knee discomfort.  She is unaware of palpitations.  I will try to obtain laboratory from Dr. Norval Gable office.  I will see her one year for cardiology reevaluation or sooner if problem arise.  Lennette Bihari, MD, [facility] [place] [facility] 01/04/2015 11:56 AM

## 2015-01-07 ENCOUNTER — Ambulatory Visit (HOSPITAL_COMMUNITY)
Admission: RE | Admit: 2015-01-07 | Discharge: 2015-01-07 | Disposition: A | Discharge status: Home / Self Care | Payer: Medicare Other | Source: Ambulatory Visit | Attending: Internal Medicine | Admitting: Internal Medicine

## 2015-01-07 DIAGNOSIS — Z1231 Encounter for screening mammogram for malignant neoplasm of breast: Secondary | ICD-10-CM | POA: Insufficient documentation

## 2015-07-11 DIAGNOSIS — Y939 Activity, unspecified: Secondary | ICD-10-CM | POA: Diagnosis not present

## 2015-07-11 DIAGNOSIS — M94262 Chondromalacia, left knee: Secondary | ICD-10-CM | POA: Diagnosis not present

## 2015-07-11 DIAGNOSIS — M23332 Other meniscus derangements, other medial meniscus, left knee: Secondary | ICD-10-CM | POA: Diagnosis not present

## 2015-07-11 DIAGNOSIS — Y929 Unspecified place or not applicable: Secondary | ICD-10-CM | POA: Diagnosis not present

## 2015-07-11 DIAGNOSIS — M1712 Unilateral primary osteoarthritis, left knee: Secondary | ICD-10-CM | POA: Diagnosis not present

## 2015-07-11 DIAGNOSIS — Y999 Unspecified external cause status: Secondary | ICD-10-CM | POA: Diagnosis not present

## 2015-07-11 DIAGNOSIS — S83232A Complex tear of medial meniscus, current injury, left knee, initial encounter: Secondary | ICD-10-CM | POA: Diagnosis not present

## 2015-07-19 DIAGNOSIS — S83242D Other tear of medial meniscus, current injury, left knee, subsequent encounter: Secondary | ICD-10-CM | POA: Diagnosis not present

## 2015-07-22 DIAGNOSIS — S83242D Other tear of medial meniscus, current injury, left knee, subsequent encounter: Secondary | ICD-10-CM | POA: Diagnosis not present

## 2015-07-25 DIAGNOSIS — S83242D Other tear of medial meniscus, current injury, left knee, subsequent encounter: Secondary | ICD-10-CM | POA: Diagnosis not present

## 2015-07-29 DIAGNOSIS — S83242D Other tear of medial meniscus, current injury, left knee, subsequent encounter: Secondary | ICD-10-CM | POA: Diagnosis not present

## 2015-08-02 DIAGNOSIS — S83242D Other tear of medial meniscus, current injury, left knee, subsequent encounter: Secondary | ICD-10-CM | POA: Diagnosis not present

## 2015-08-05 DIAGNOSIS — S83242D Other tear of medial meniscus, current injury, left knee, subsequent encounter: Secondary | ICD-10-CM | POA: Diagnosis not present

## 2015-08-08 DIAGNOSIS — S83242D Other tear of medial meniscus, current injury, left knee, subsequent encounter: Secondary | ICD-10-CM | POA: Diagnosis not present

## 2015-08-12 DIAGNOSIS — S83242D Other tear of medial meniscus, current injury, left knee, subsequent encounter: Secondary | ICD-10-CM | POA: Diagnosis not present

## 2015-08-15 DIAGNOSIS — S83242D Other tear of medial meniscus, current injury, left knee, subsequent encounter: Secondary | ICD-10-CM | POA: Diagnosis not present

## 2015-08-19 DIAGNOSIS — S83242D Other tear of medial meniscus, current injury, left knee, subsequent encounter: Secondary | ICD-10-CM | POA: Diagnosis not present

## 2015-08-22 DIAGNOSIS — S83242D Other tear of medial meniscus, current injury, left knee, subsequent encounter: Secondary | ICD-10-CM | POA: Diagnosis not present

## 2015-10-11 DIAGNOSIS — M1712 Unilateral primary osteoarthritis, left knee: Secondary | ICD-10-CM | POA: Diagnosis not present

## 2015-10-18 DIAGNOSIS — M1712 Unilateral primary osteoarthritis, left knee: Secondary | ICD-10-CM | POA: Diagnosis not present

## 2015-10-24 DIAGNOSIS — M1712 Unilateral primary osteoarthritis, left knee: Secondary | ICD-10-CM | POA: Diagnosis not present

## 2015-10-28 DIAGNOSIS — I10 Essential (primary) hypertension: Secondary | ICD-10-CM | POA: Diagnosis not present

## 2015-10-28 DIAGNOSIS — E784 Other hyperlipidemia: Secondary | ICD-10-CM | POA: Diagnosis not present

## 2015-11-01 DIAGNOSIS — Z0000 Encounter for general adult medical examination without abnormal findings: Secondary | ICD-10-CM | POA: Diagnosis not present

## 2015-11-01 DIAGNOSIS — Z1389 Encounter for screening for other disorder: Secondary | ICD-10-CM | POA: Diagnosis not present

## 2015-11-01 DIAGNOSIS — E784 Other hyperlipidemia: Secondary | ICD-10-CM | POA: Diagnosis not present

## 2015-11-01 DIAGNOSIS — H9193 Unspecified hearing loss, bilateral: Secondary | ICD-10-CM | POA: Diagnosis not present

## 2015-11-01 DIAGNOSIS — I10 Essential (primary) hypertension: Secondary | ICD-10-CM | POA: Diagnosis not present

## 2015-11-01 DIAGNOSIS — M25562 Pain in left knee: Secondary | ICD-10-CM | POA: Diagnosis not present

## 2015-11-01 DIAGNOSIS — Z6824 Body mass index (BMI) 24.0-24.9, adult: Secondary | ICD-10-CM | POA: Diagnosis not present

## 2015-11-08 DIAGNOSIS — Z1212 Encounter for screening for malignant neoplasm of rectum: Secondary | ICD-10-CM | POA: Diagnosis not present

## 2015-11-21 DIAGNOSIS — Z780 Asymptomatic menopausal state: Secondary | ICD-10-CM | POA: Diagnosis not present

## 2015-11-29 DIAGNOSIS — H903 Sensorineural hearing loss, bilateral: Secondary | ICD-10-CM | POA: Diagnosis not present

## 2015-12-16 DIAGNOSIS — Z4789 Encounter for other orthopedic aftercare: Secondary | ICD-10-CM | POA: Diagnosis not present

## 2015-12-20 ENCOUNTER — Other Ambulatory Visit: Payer: Self-pay | Admitting: Obstetrics and Gynecology

## 2015-12-20 DIAGNOSIS — Z1231 Encounter for screening mammogram for malignant neoplasm of breast: Secondary | ICD-10-CM

## 2016-01-13 ENCOUNTER — Ambulatory Visit: Payer: Medicare Other

## 2016-01-20 ENCOUNTER — Ambulatory Visit
Admission: RE | Admit: 2016-01-20 | Discharge: 2016-01-20 | Disposition: A | Discharge status: Home / Self Care | Payer: Medicare Other | Source: Ambulatory Visit | Attending: Obstetrics and Gynecology | Admitting: Obstetrics and Gynecology

## 2016-01-20 DIAGNOSIS — Z1231 Encounter for screening mammogram for malignant neoplasm of breast: Secondary | ICD-10-CM | POA: Diagnosis not present

## 2016-02-18 ENCOUNTER — Encounter: Payer: Self-pay | Admitting: Cardiovascular Disease

## 2016-02-18 ENCOUNTER — Ambulatory Visit (INDEPENDENT_AMBULATORY_CARE_PROVIDER_SITE_OTHER): Payer: Medicare Other | Admitting: Cardiovascular Disease

## 2016-02-18 VITALS — BP 122/64 | HR 71 | Ht 67.0 in | Wt 147.9 lb

## 2016-02-18 DIAGNOSIS — I10 Essential (primary) hypertension: Secondary | ICD-10-CM | POA: Diagnosis not present

## 2016-02-18 DIAGNOSIS — I340 Nonrheumatic mitral (valve) insufficiency: Secondary | ICD-10-CM | POA: Diagnosis not present

## 2016-02-18 DIAGNOSIS — E785 Hyperlipidemia, unspecified: Secondary | ICD-10-CM

## 2016-02-18 DIAGNOSIS — R0789 Other chest pain: Secondary | ICD-10-CM

## 2016-02-18 NOTE — Patient Instructions (Signed)

## 2016-02-19 ENCOUNTER — Ambulatory Visit: Payer: Medicare Other | Admitting: Cardiovascular Disease

## 2016-02-19 NOTE — Progress Notes (Signed)
Patient ID: Candice Richardson, female   DOB: 1945/06/09, 70 y.o.   MRN: [id number]    PCP: Dr. Dossie Arbouran Paterson  HPI:  Candice Richardson is a 70 year old female who presents for one-year follow-up cardiology evaluation.  [name] has a history of hypertension, GERD, as well as hyperlipidemia.  She was told of having a heart murmur over 15 years ago when living in [place] and underwent frequent echo Doppler studies, as well as stress tests.  She does exercise regularly.  I first saw her in 2015 when she was referred to me by Dr. Jarold MottoPatterson after she has noticed some left sided chest pain, which he feels is more positional and muscular.   She described the discomfort at times as a "throbbing ache."  Sometimes, sitting makes it worse.  She denied a clear-cut exertional precipitation.  I saw her prior to her planned travel and was on amlodipine 5 mg and Lopressor 50 mg daily for blood pressure control.  She has a history of GERD which has benefited from omeprazole.  She had not had any subsequent echo Doppler studies or stress test since she moved to the [place] area from [place] several years ago.  When I saw her, I recommend over-the-counter Aleve 4 mg twice a day for 3-4 days and then one tablet twice a day.  This resolved her probable musculoskeletal chest pain.  She underwent  a routine treadmill test and reportedly had approximately 2 mm of horizontal ST depression that was completely asymptomatic at peak stress achieving a workload of 11 mets maximal heart rate of 136 beats per minute, representing 90% of predicted maximum.  Subsequently, she a nuclear myocardial perfusion was entirely normal and showed normal LV function, normal wall motion and a post-rest ejection fraction of 69%.  Perfusion was normal to all regions and no significant ECG changes.developed. An echo Doppler study revealed an ejection fraction of 55-60% without wall motion abnormalities.  She had normal diastolic parameters.  Her mitral valve was mildly thickened with mild regurgitation and there was mild TR.  There was mild pulmonary hypertension with estimated PA pressure 37.  Since I saw her one year ago, she has continued to be active.  She has lost weight.  She states that she has "eat right for blood type.  ".  She has markedly reduced be intake.  Her blood pressure has improved.  Her laboratory recently checked by Dr. Jarold MottoPatterson reportedly was excellent.  She has essentially reduced be intake.  She has had issues with left knee discomfort and also issues with her right thumb.  She tells me she is to have a cortisone injection.   She has been taking amlodipine 5 mg and metoprolol succinate 50 mg daily for hypertension and Crestor 20 mg for hyperlipidemia.  She has been taking a baby aspirin.  She denies chest pain, PND, orthopnea, presyncope or syncope.  She presents for reevaluation.  Past Medical History:  Diagnosis Date  . GERD (gastroesophageal reflux disease)   . Hyperlipidemia   . Hypertension     Past Surgical History:  Procedure Laterality Date  . APPENDECTOMY  1947   7218 months old    No Known Allergies  Current Outpatient Prescriptions  Medication Sig Dispense Refill  . amLODipine (NORVASC) 5 MG tablet Take 5 mg by mouth daily.    Marland Kitchen. aspirin 81 MG tablet Take 81 mg by mouth daily.    . calcium carbonate (OS-CAL) 600 MG TABS Take 600 mg by mouth  2 (two) times daily with a meal.    . metoprolol (LOPRESSOR) 50 MG tablet Take 50 mg by mouth daily.    . Misc Natural Products (GLUCOSAMINE CHOND DOUBLE STR PO) Take 2 tablets by mouth daily.    . Multiple Vitamins-Minerals (MULTIVITAMIN WITH MINERALS) tablet Take 1 tablet by mouth daily.    Marland Kitchen omeprazole (PRILOSEC) 20 MG capsule Take 20 mg by mouth daily.    Marland Kitchen PREMARIN vaginal cream Place 1 application vaginally Once a week.    . rosuvastatin (CRESTOR) 20 MG tablet Take 20 mg by mouth daily.  2  . vitamin B-12 (CYANOCOBALAMIN) 500 MCG tablet Take 500 mcg  by mouth daily.     No current facility-administered medications for this visit.     Socially, she was originally from [place].  She lived in [place] for approximately 10-15 years.  She is widowed.  She does travel.  She has 2 children and 4 grandchildren.  She does drink occasional wine.  There is no tobacco use  Family History  Problem Relation Age of Onset  . Heart disease Mother   . Stomach cancer Father   . Colon polyps Brother   . Colon cancer Cousin     ROS General: Negative; No fevers, chills, or night sweats HEENT: Negative; No changes in vision or hearing, sinus congestion, difficulty swallowing Pulmonary: Negative; No cough, wheezing, shortness of breath, hemoptysis Cardiovascular: Positive for left-sided chest pain, no presyncope, syncope, palpatations GI: Positive for GERD No nausea, vomiting, diarrhea, or abdominal pain GU: Negative; No dysuria, hematuria, or difficulty voiding Musculoskeletal: Knee discomfort Hematologic/Oncologic: Negative; no easy bruising, bleeding Endocrine: Negative; no heat/cold intolerance; no diabetes Neuro: Negative; no changes in balance, headaches Skin: Negative; No rashes or skin lesions Psychiatric: Negative; No behavioral problems, depression Sleep: Negative; No daytime sleepiness, hypersomnolence, bruxism, restless legs, hypnogagnic hallucinations Other comprehensive 14 point system review is negative  Physical Exam BP 122/64 (BP Location: Left Arm, Patient Position: Sitting, Cuff Size: Normal)   Pulse 71   Ht 5\' 7"  (1.702 m)   Wt 147 lb 14.4 oz (67.1 kg)   BMI 23.16 kg/m    Repeat blood pressure 114/68  Wt Readings from Last 3 Encounters:  02/18/16 147 lb 14.4 oz (67.1 kg)  01/02/15 148 lb 5 oz (67.3 kg)  12/11/13 150 lb 1.6 oz (68.1 kg)   General: Alert, oriented, no distress.  Skin: normal turgor, no rashes, warm and dry HEENT: Normocephalic, atraumatic. Pupils equal round and reactive to light; sclera  anicteric; extraocular muscles intact; Fundi no AV nicking, hemorrhages, or exudates. Nose without nasal septal hypertrophy Mouth/Parynx benign; Mallinpatti scale 2 Neck: No JVD, no carotid bruits; normal carotid upstroke Lungs: clear to ausculatation and percussion; no wheezing or rales Chest wall: Resolution of prior tenderness to palpitation over the left costochondral region Heart: PMI not displaced, RRR, s1 s2 normal, 1/6 systolic murmur, no diastolic murmur, no rubs, gallops, thrills, or heaves Abdomen: soft, nontender; no hepatosplenomehaly, BS+; abdominal aorta nontender and not dilated by palpation. Back: no CVA tenderness Pulses 2+ Musculoskeletal: full range of motion, normal strength, no joint deformities Extremities: no clubbing cyanosis or edema, Homan's sign negative  Neurologic: grossly nonfocal; Cranial nerves grossly wnl Psychologic: Normal mood and affect  ECG (independently read by me): Normal sinus rhythm at 71 bpm.  No ectopy.  Normal intervals.  August 2016 ECG (independently read by me): Normal sinus rhythm at 60 bpm.  No ectopy.  Normal intervals.  May 2015  ECG (  independently read by me): Normal sinus rhythm at 73 beats per minute.  No ectopy.  Normal intervals.  LABS: Recently done by Dr. Jarome Matin.  BMET No results found for: NA   Hepatic Function Panel  No results found for: PROT   CBC No results found for: WBC   BNP No results found for: PROBNP  Lipid Panel  No results found for: CHOL    RADIOLOGY: No results found.   ASSESSMENT AND PLAN: Ms. Cohenour is a 70 year-old female who has a history of hypertension, hyperlipidemia, with cholesterols as high as 240 in the past, as well as GERD.  She had been taking simvastatin 40 mg for her lipid abnormality and  amlodipine 5 mg and metoprolol 50 mg for her blood pressure.  I suspect her chest pain from one year ago was musculoskeletal in etiology.  When I previously seen her, I recommended  changing simvastatin to Crestor 20 mg, particularly with her concomitant amlodipine administration for more optimal therapy since the recommended maximum simvastatin dose is 20 mg with amlodipine.  She has significant leak changed her diet and is now significantly reducing any beef intake.  She states her lipids were excellent when checked by Dr. Jarold Motto.  Her blood pressure today is well controlled and she is tolerating amlodipine and metoprolol.  Her resting pulse is in the 70s.  She's not having any palpitations or ischemic type symptomatology.  She has been bothered by knee discomfort and will be getting a cortisone injection.  She has a history of GERD, which is controlled with omeprazole.  She continues to take aspirin 81 mg.  I have encouraged continued exercise.  She will be going to [place] and [place] for 2 week vacation in mid October.  I will see her in one year for cardiology reevaluation.  Lennette Bihari, MD, [facility] 02/19/2016 6:34 PM

## 2016-02-24 DIAGNOSIS — R309 Painful micturition, unspecified: Secondary | ICD-10-CM | POA: Diagnosis not present

## 2016-02-24 DIAGNOSIS — M1712 Unilateral primary osteoarthritis, left knee: Secondary | ICD-10-CM | POA: Diagnosis not present

## 2016-03-23 ENCOUNTER — Telehealth: Payer: Self-pay | Admitting: *Deleted

## 2016-03-23 DIAGNOSIS — M1712 Unilateral primary osteoarthritis, left knee: Secondary | ICD-10-CM | POA: Diagnosis not present

## 2016-03-23 NOTE — Telephone Encounter (Signed)
Requesting surgical clearance:   1. Type of surgery: LEFT KNEE TKA  2. Surgeon: Dr Elvera Lennox. Valma CavaAndrew Richardson  3. Surgical date: pending clearance  4. Medications that need to be held: Aspirin     5. I will defer to: Dr Dewain PenningKelly   [facility] Fax- [phone] Phone- [phone]

## 2016-03-24 NOTE — Telephone Encounter (Signed)
Clearance routed to number provided.  

## 2016-03-24 NOTE — Telephone Encounter (Signed)
Ok for surgery; ok to hold ASA

## 2016-04-01 ENCOUNTER — Ambulatory Visit: Payer: Self-pay | Admitting: Orthopedic Surgery

## 2016-04-03 DIAGNOSIS — R05 Cough: Secondary | ICD-10-CM | POA: Diagnosis not present

## 2016-04-03 DIAGNOSIS — R042 Hemoptysis: Secondary | ICD-10-CM | POA: Diagnosis not present

## 2016-04-03 DIAGNOSIS — I10 Essential (primary) hypertension: Secondary | ICD-10-CM | POA: Diagnosis not present

## 2016-04-03 DIAGNOSIS — Z6824 Body mass index (BMI) 24.0-24.9, adult: Secondary | ICD-10-CM | POA: Diagnosis not present

## 2016-04-08 DIAGNOSIS — Z23 Encounter for immunization: Secondary | ICD-10-CM | POA: Diagnosis not present

## 2016-04-27 DIAGNOSIS — M1712 Unilateral primary osteoarthritis, left knee: Secondary | ICD-10-CM | POA: Diagnosis not present

## 2016-04-27 NOTE — H&P (Signed)
TOTAL KNEE ADMISSION H&P  Patient is being admitted for left total knee arthroplasty.  Subjective:  Chief Complaint:left knee pain.  HPI: Candice Richardson, 70 y.o. female, has a history of pain and functional disability in the left knee due to arthritis and has failed non-surgical conservative treatments for greater than 12 weeks to includeNSAID's and/or analgesics, corticosteriod injections, viscosupplementation injections, use of assistive devices, weight reduction as appropriate and activity modification.  Onset of symptoms was gradual, starting 2 years ago with gradually worsening course since that time. The patient noted prior procedures on the knee to include  arthroscopy on the left knee(s).  Patient currently rates pain in the left knee(s) at 6 out of 10 with activity. Patient has night pain, worsening of pain with activity and weight bearing, pain that interferes with activities of daily living and pain with passive range of motion.  Patient has evidence of periarticular osteophytes and joint space narrowing by imaging studies. There is no active infection.  Patient Active Problem List   Diagnosis Date Noted  . Mild mitral regurgitation 12/17/2013  . Atypical chest pain 10/05/2013  . GERD (gastroesophageal reflux disease) 10/05/2013  . HTN (hypertension) 10/05/2013  . Hyperlipidemia 10/05/2013   Past Medical History:  Diagnosis Date  . GERD (gastroesophageal reflux disease)   . Hyperlipidemia   . Hypertension     Past Surgical History:  Procedure Laterality Date  . APPENDECTOMY  1947   2118 months old    No prescriptions prior to admission.   No Known Allergies  Social History  Substance Use Topics  . Smoking status: Never Smoker  . Smokeless tobacco: Never Used  . Alcohol use 5.4 oz/week    9 Glasses of wine per week    Family History  Problem Relation Age of Onset  . Heart disease Mother   . Stomach cancer Father   . Colon polyps Brother   . Colon cancer Cousin       Review of Systems  Constitutional: Negative.   HENT: Negative.   Eyes: Negative.   Respiratory: Negative.   Cardiovascular: Negative for chest pain, palpitations and orthopnea.  Gastrointestinal: Negative.   Genitourinary: Negative.   Musculoskeletal: Positive for joint pain.  Skin: Negative.   Neurological: Negative.   Endo/Heme/Allergies: Negative.   Psychiatric/Behavioral: Negative.     Objective:  Physical Exam  Constitutional: She is oriented to person, place, and time. She appears well-developed.  HENT:  Head: Normocephalic.  Eyes: EOM are normal.  Neck: Normal range of motion.  Cardiovascular: Normal rate and intact distal pulses.   Respiratory: Effort normal.  GI: Soft.  Musculoskeletal:  Left knee pain medially. LLE grossly n/v intact. Knee is stable.  Neurological: She is alert and oriented to person, place, and time.  Skin: Skin is warm and dry.  Psychiatric: Her behavior is normal.    Vital signs in last 24 hours: BP: ()/()  Arterial Line BP: ()/()   Labs:   Estimated body mass index is 23.16 kg/m as calculated from the following:   Height as of 02/18/16: 5\' 7"  (1.702 m).   Weight as of 02/18/16: 67.1 kg (147 lb 14.4 oz).   Imaging Review Plain radiographs demonstrate moderate degenerative joint disease of the left knee(s). The overall alignment ismild varus. The bone quality appears to be good for age and reported activity level.  Assessment/Plan:  End stage arthritis, left knee   The patient history, physical examination, clinical judgment of the provider and imaging studies are consistent with end  stage degenerative joint disease of the left knee(s) and total knee arthroplasty is deemed medically necessary. The treatment options including medical management, injection therapy arthroscopy and arthroplasty were discussed at length. The risks and benefits of total knee arthroplasty were presented and reviewed. The risks due to aseptic loosening,  infection, stiffness, patella tracking problems, thromboembolic complications and other imponderables were discussed. The patient acknowledged the explanation, agreed to proceed with the plan and consent was signed. Patient is being admitted for inpatient treatment for surgery, pain control, PT, OT, prophylactic antibiotics, VTE prophylaxis, progressive ambulation and ADL's and discharge planning. The patient is planning to be discharged home with home health services.  Will not use IV tranexamic acid. Due to cardiac hx.

## 2016-04-28 ENCOUNTER — Other Ambulatory Visit (HOSPITAL_COMMUNITY): Payer: Self-pay | Admitting: *Deleted

## 2016-04-28 NOTE — Patient Instructions (Addendum)
Graciella BeltonDianne WUJWJXBTubaugh  04/28/2016   Your procedure is scheduled on: 05-08-16  [facility]  [facility] to  Short Stay Center at 830  AM.  Call this number if you have problems the morning of surgery [phone]   Remember: ONLY 1 PERSON MAY GO WITH YOU TO SHORT STAY TO GET  READY MORNING OF YOUR SURGERY.  Do not eat food or drink liquids :After Midnight.     Take these medicines the morning of surgery with A SIP OF WATER: Metoprolol-Succinate, Rosuvastatin (Crestor)                               You may not have any metal on your body including hair pins and              piercings  Do not wear jewelry, make-up, lotions, powders or perfumes, deodorant             Do not wear nail polish.  Do not shave  48 hours prior to surgery.              Men may shave face and neck.   Do not bring valuables to the hospital. [facility] IS NOT             RESPONSIBLE   FOR VALUABLES.  Contacts, dentures or bridgework may not be worn into surgery.  Leave suitcase in the car. After surgery it may be brought to your room.                  Please read over the following fact sheets you were given: _____________________________________________________________________             [facility] Pre[facility] Before surgery, you can play an important role.  Because skin is not sterile, your skin needs to be as free of germs as possible.  You can reduce the number of germs on your skin by washing with CHG (chlorahexidine gluconate) soap before surgery.  CHG is an antiseptic cleaner which kills germs and bonds with the skin to continue killing germs even after washing. Please DO NOT use if you have an allergy to CHG or antibacterial soaps.  If your skin becomes reddened/irritated stop using the CHG and inform your nurse when you arrive at Short Stay. Do not shave (including legs and underarms) for at least 48 hours prior to the first  CHG shower.  You may shave your face/neck. Please follow these instructions carefully:  1.  Shower with CHG Soap the night before surgery and the  morning of Surgery.  2.  If you choose to wash your hair, wash your hair first as usual with your  normal  shampoo.  3.  After you shampoo, rinse your hair and body thoroughly to remove the  shampoo.                           4.  Use CHG as you would any other liquid soap.  You can apply chg directly  to the skin and wash                       Gently with a scrungie or clean washcloth.  5.  Apply the CHG  Soap to your body ONLY FROM THE NECK DOWN.   Do not use on face/ open                           Wound or open sores. Avoid contact with eyes, ears mouth and genitals (private parts).                       Wash face,  Genitals (private parts) with your normal soap.             6.  Wash thoroughly, paying special attention to the area where your surgery  will be performed.  7.  Thoroughly rinse your body with warm water from the neck down.  8.  DO NOT shower/wash with your normal soap after using and rinsing off  the CHG Soap.                9.  Pat yourself dry with a clean towel.            10.  Wear clean pajamas.            11.  Place clean sheets on your bed the night of your first shower and do not  sleep with pets. Day of Surgery : Do not apply any lotions/deodorants the morning of surgery.  Please wear clean clothes to the hospital/surgery center.  FAILURE TO FOLLOW THESE INSTRUCTIONS MAY RESULT IN THE CANCELLATION OF YOUR SURGERY PATIENT SIGNATURE_________________________________  NURSE SIGNATURE__________________________________  ________________________________________________________________________   Adam Phenix  An incentive spirometer is a tool that can help keep your lungs clear and active. This tool measures how well you are filling your lungs with each breath. Taking long deep breaths may help reverse or decrease the  chance of developing breathing (pulmonary) problems (especially infection) following:  A long period of time when you are unable to move or be active. BEFORE THE PROCEDURE   If the spirometer includes an indicator to show your best effort, your nurse or respiratory therapist will set it to a desired goal.  If possible, sit up straight or lean slightly forward. Try not to slouch.  Hold the incentive spirometer in an upright position. INSTRUCTIONS FOR USE  1. Sit on the edge of your bed if possible, or sit up as far as you can in bed or on a chair. 2. Hold the incentive spirometer in an upright position. 3. Breathe out normally. 4. Place the mouthpiece in your mouth and seal your lips tightly around it. 5. Breathe in slowly and as deeply as possible, raising the piston or the ball toward the top of the column. 6. Hold your breath for 3-5 seconds or for as long as possible. Allow the piston or ball to fall to the bottom of the column. 7. Remove the mouthpiece from your mouth and breathe out normally. 8. Rest for a few seconds and repeat Steps 1 through 7 at least 10 times every 1-2 hours when you are awake. Take your time and take a few normal breaths between deep breaths. 9. The spirometer may include an indicator to show your best effort. Use the indicator as a goal to work toward during each repetition. 10. After each set of 10 deep breaths, practice coughing to be sure your lungs are clear. If you have an incision (the cut made at the time of surgery), support your incision when coughing by placing a pillow or rolled up towels  firmly against it. Once you are able to get out of bed, walk around indoors and cough well. You may stop using the incentive spirometer when instructed by your caregiver.  RISKS AND COMPLICATIONS  Take your time so you do not get dizzy or light-headed.  If you are in pain, you may need to take or ask for pain medication before doing incentive spirometry. It is harder  to take a deep breath if you are having pain. AFTER USE  Rest and breathe slowly and easily.  It can be helpful to keep track of a log of your progress. Your caregiver can provide you with a simple table to help with this. If you are using the spirometer at home, follow these instructions: SEEK MEDICAL CARE IF:   You are having difficultly using the spirometer.  You have trouble using the spirometer as often as instructed.  Your pain medication is not giving enough relief while using the spirometer.  You develop fever of 100.5 F (38.1 C) or higher. SEEK IMMEDIATE MEDICAL CARE IF:   You cough up bloody sputum that had not been present before.  You develop fever of 102 F (38.9 C) or greater.  You develop worsening pain at or near the incision site. MAKE SURE YOU:   Understand these instructions.  Will watch your condition.  Will get help right away if you are not doing well or get worse. Document Released: 09/21/2006 Document Revised: 08/03/2011 Document Reviewed: 11/22/2006 [facility] Patient Information 2014 [employer] [place] [place].   ________________________________________________________________________

## 2016-05-01 ENCOUNTER — Encounter (HOSPITAL_COMMUNITY)
Admission: RE | Admit: 2016-05-01 | Discharge: 2016-05-01 | Disposition: A | Discharge status: Home / Self Care | Payer: Medicare Other | Source: Ambulatory Visit | Attending: Specialist | Admitting: Specialist

## 2016-05-01 ENCOUNTER — Encounter (HOSPITAL_COMMUNITY): Payer: Self-pay

## 2016-05-01 ENCOUNTER — Encounter (INDEPENDENT_AMBULATORY_CARE_PROVIDER_SITE_OTHER): Payer: Self-pay

## 2016-05-01 DIAGNOSIS — Z01812 Encounter for preprocedural laboratory examination: Secondary | ICD-10-CM | POA: Insufficient documentation

## 2016-05-01 HISTORY — DX: Unspecified tear of unspecified meniscus, current injury, unspecified knee, initial encounter: S83.209A

## 2016-05-01 HISTORY — DX: Cardiac murmur, unspecified: R01.1

## 2016-05-01 HISTORY — DX: Unspecified osteoarthritis, unspecified site: M19.90

## 2016-05-01 LAB — BASIC METABOLIC PANEL
Anion gap: 8 (ref 5–15)
BUN: 20 mg/dL (ref 6–20)
CO2: 30 mmol/L (ref 22–32)
Calcium: 9.4 mg/dL (ref 8.9–10.3)
Chloride: 103 mmol/L (ref 101–111)
Creatinine, Ser: 0.66 mg/dL (ref 0.44–1.00)
GFR calc Af Amer: >60 mL/min (ref >60)
GLUCOSE: 97 mg/dL (ref 65–99)
POTASSIUM: 4.1 mmol/L (ref 3.5–5.1)
Sodium: 141 mmol/L (ref 135–145)

## 2016-05-01 LAB — PROTIME-INR
INR: 0.91
Prothrombin Time: 12.2 seconds (ref 11.4–15.2)

## 2016-05-01 LAB — CBC
HEMATOCRIT: 44.1 % (ref 36.0–46.0)
Hemoglobin: 14.8 g/dL (ref 12.0–15.0)
MCH: 32.2 pg (ref 26.0–34.0)
MCHC: 33.6 g/dL (ref 30.0–36.0)
MCV: 96.1 fL (ref 78.0–100.0)
Platelets: 276 10*3/uL (ref 150–400)
RBC: 4.59 MIL/uL (ref 3.87–5.11)
RDW: 12.9 % (ref 11.5–15.5)
WBC: 8.2 10*3/uL (ref 4.0–10.5)

## 2016-05-01 LAB — SURGICAL PCR SCREEN
MRSA, PCR: NEGATIVE
Staphylococcus aureus: NEGATIVE

## 2016-05-01 LAB — URINALYSIS, ROUTINE W REFLEX MICROSCOPIC
Bilirubin Urine: NEGATIVE
Glucose, UA: NEGATIVE mg/dL
Hgb urine dipstick: NEGATIVE
Ketones, ur: NEGATIVE mg/dL
LEUKOCYTES UA: NEGATIVE
Nitrite: NEGATIVE
PROTEIN: NEGATIVE mg/dL
Specific Gravity, Urine: 1.019 (ref 1.005–1.030)
pH: 5 (ref 5.0–8.0)

## 2016-05-01 LAB — ABO/RH: ABO/RH(D): A POS

## 2016-05-01 LAB — APTT: APTT: 29 s (ref 24–36)

## 2016-05-01 NOTE — Pre-Procedure Instructions (Signed)
Medical Clearance, Dr. Ivery Qualeaniel Patterson, 10-31-15 on chart Cardiac Clearance note, Dr. Tresa EndoKelly, 03-24-16 epic EKG 02-17-14 epic

## 2016-05-08 ENCOUNTER — Encounter (HOSPITAL_COMMUNITY): Payer: Self-pay | Admitting: *Deleted

## 2016-05-08 ENCOUNTER — Inpatient Hospital Stay (HOSPITAL_COMMUNITY): Payer: Medicare Other | Admitting: Anesthesiology

## 2016-05-08 ENCOUNTER — Encounter (HOSPITAL_COMMUNITY)
Admission: RE | Disposition: A | Discharge status: Home Health | Payer: Self-pay | Source: Ambulatory Visit | Attending: Specialist

## 2016-05-08 ENCOUNTER — Inpatient Hospital Stay (HOSPITAL_COMMUNITY)
Admission: RE | Admit: 2016-05-08 | Discharge: 2016-05-10 | DRG: 470 | Disposition: A | Discharge status: Home Health | Payer: Medicare Other | Source: Ambulatory Visit | Attending: Specialist | Admitting: Specialist

## 2016-05-08 DIAGNOSIS — K219 Gastro-esophageal reflux disease without esophagitis: Secondary | ICD-10-CM | POA: Diagnosis present

## 2016-05-08 DIAGNOSIS — I10 Essential (primary) hypertension: Secondary | ICD-10-CM | POA: Diagnosis present

## 2016-05-08 DIAGNOSIS — M1712 Unilateral primary osteoarthritis, left knee: Principal | ICD-10-CM | POA: Diagnosis present

## 2016-05-08 DIAGNOSIS — E785 Hyperlipidemia, unspecified: Secondary | ICD-10-CM | POA: Diagnosis present

## 2016-05-08 DIAGNOSIS — Z96652 Presence of left artificial knee joint: Secondary | ICD-10-CM

## 2016-05-08 DIAGNOSIS — M25562 Pain in left knee: Secondary | ICD-10-CM | POA: Diagnosis present

## 2016-05-08 DIAGNOSIS — R269 Unspecified abnormalities of gait and mobility: Secondary | ICD-10-CM | POA: Diagnosis not present

## 2016-05-08 HISTORY — PX: TOTAL KNEE ARTHROPLASTY: SHX125

## 2016-05-08 LAB — TYPE AND SCREEN
ABO/RH(D): A POS
Antibody Screen: NEGATIVE

## 2016-05-08 LAB — CREATININE, SERUM
CREATININE: 0.66 mg/dL (ref 0.44–1.00)
GFR calc Af Amer: >60 mL/min (ref >60)
GFR calc non Af Amer: >60 mL/min (ref >60)

## 2016-05-08 LAB — CBC
HCT: 40.5 % (ref 36.0–46.0)
Hemoglobin: 14.1 g/dL (ref 12.0–15.0)
MCH: 33.2 pg (ref 26.0–34.0)
MCHC: 34.8 g/dL (ref 30.0–36.0)
MCV: 95.3 fL (ref 78.0–100.0)
PLATELETS: 242 10*3/uL (ref 150–400)
RBC: 4.25 MIL/uL (ref 3.87–5.11)
RDW: 12.8 % (ref 11.5–15.5)
WBC: 10.6 10*3/uL — ABNORMAL HIGH (ref 4.0–10.5)

## 2016-05-08 SURGERY — ARTHROPLASTY, KNEE, TOTAL
Anesthesia: Monitor Anesthesia Care | Site: Knee | Laterality: Left

## 2016-05-08 MED ORDER — ACETAMINOPHEN 325 MG PO TABS
650.0000 mg | ORAL_TABLET | Freq: Four times a day (QID) | ORAL | Status: DC | PRN
Start: 2016-05-08 — End: 2016-05-10
  Filled 2016-05-08: qty 2

## 2016-05-08 MED ORDER — ONDANSETRON HCL 4 MG PO TABS: 4.0000 mg | ORAL_TABLET | Freq: Four times a day (QID) | ORAL | Status: DC | PRN

## 2016-05-08 MED ORDER — BUPIVACAINE HCL (PF) 0.25 % IJ SOLN
INTRAMUSCULAR | Status: DC | PRN
  Administered 2016-05-08: 30 mL

## 2016-05-08 MED ORDER — CEFAZOLIN SODIUM-DEXTROSE 2-4 GM/100ML-% IV SOLN
2.0000 g | INTRAVENOUS | Status: AC
  Administered 2016-05-08: 2 g via INTRAVENOUS
  Filled 2016-05-08: qty 100

## 2016-05-08 MED ORDER — HYDROMORPHONE HCL 2 MG/ML IJ SOLN: 0.5000 mg | INTRAMUSCULAR | Status: DC | PRN

## 2016-05-08 MED ORDER — MIDAZOLAM HCL 5 MG/5ML IJ SOLN
INTRAMUSCULAR | Status: DC | PRN
  Administered 2016-05-08: 2 mg via INTRAVENOUS

## 2016-05-08 MED ORDER — CEFAZOLIN IN D5W 1 GM/50ML IV SOLN
1.0000 g | Freq: Four times a day (QID) | INTRAVENOUS | Status: AC
  Administered 2016-05-08 (×2): 1 g via INTRAVENOUS
  Filled 2016-05-08 (×2): qty 50

## 2016-05-08 MED ORDER — SODIUM CHLORIDE 0.9 % IJ SOLN
INTRAMUSCULAR | Status: AC
  Filled 2016-05-08: qty 50

## 2016-05-08 MED ORDER — PROPOFOL 10 MG/ML IV BOLUS
INTRAVENOUS | Status: AC
  Filled 2016-05-08: qty 40

## 2016-05-08 MED ORDER — POVIDONE-IODINE 7.5 % EX SOLN: Freq: Once | CUTANEOUS | Status: DC

## 2016-05-08 MED ORDER — DEXAMETHASONE SODIUM PHOSPHATE 10 MG/ML IJ SOLN
INTRAMUSCULAR | Status: AC
  Filled 2016-05-08: qty 1

## 2016-05-08 MED ORDER — ROSUVASTATIN CALCIUM 20 MG PO TABS
20.0000 mg | ORAL_TABLET | Freq: Every day | ORAL | Status: DC
  Administered 2016-05-09 – 2016-05-10 (×2): 20 mg via ORAL
  Filled 2016-05-08 (×2): qty 1

## 2016-05-08 MED ORDER — AMLODIPINE BESYLATE 5 MG PO TABS
5.0000 mg | ORAL_TABLET | Freq: Every evening | ORAL | Status: DC
  Filled 2016-05-08 (×2): qty 1

## 2016-05-08 MED ORDER — ENOXAPARIN SODIUM 30 MG/0.3ML ~~LOC~~ SOLN
30.0000 mg | Freq: Two times a day (BID) | SUBCUTANEOUS | Status: DC
  Administered 2016-05-09 – 2016-05-10 (×3): 30 mg via SUBCUTANEOUS
  Filled 2016-05-08 (×3): qty 0.3

## 2016-05-08 MED ORDER — DEXAMETHASONE SODIUM PHOSPHATE 10 MG/ML IJ SOLN
INTRAMUSCULAR | Status: DC | PRN
  Administered 2016-05-08: 10 mg via INTRAVENOUS

## 2016-05-08 MED ORDER — PHENYLEPHRINE 40 MCG/ML (10ML) SYRINGE FOR IV PUSH (FOR BLOOD PRESSURE SUPPORT)
PREFILLED_SYRINGE | INTRAVENOUS | Status: AC
  Filled 2016-05-08: qty 10

## 2016-05-08 MED ORDER — SODIUM CHLORIDE 0.9 % IV SOLN
INTRAVENOUS | Status: DC | PRN
  Administered 2016-05-08: 50 ug/min via INTRAVENOUS

## 2016-05-08 MED ORDER — ACETAMINOPHEN 650 MG RE SUPP: 650.0000 mg | Freq: Four times a day (QID) | RECTAL | Status: DC | PRN

## 2016-05-08 MED ORDER — SODIUM CHLORIDE 0.9 % IJ SOLN
INTRAMUSCULAR | Status: DC | PRN
  Administered 2016-05-08: 29 mL

## 2016-05-08 MED ORDER — FENTANYL CITRATE (PF) 100 MCG/2ML IJ SOLN
INTRAMUSCULAR | Status: DC | PRN
  Administered 2016-05-08: 100 ug via INTRAVENOUS

## 2016-05-08 MED ORDER — BISACODYL 5 MG PO TBEC: 5.0000 mg | DELAYED_RELEASE_TABLET | Freq: Every day | ORAL | Status: DC | PRN

## 2016-05-08 MED ORDER — METOPROLOL SUCCINATE ER 50 MG PO TB24
50.0000 mg | ORAL_TABLET | Freq: Every day | ORAL | Status: DC
  Filled 2016-05-08 (×2): qty 1

## 2016-05-08 MED ORDER — SODIUM CHLORIDE 0.9 % IV SOLN
INTRAVENOUS | Status: DC | PRN
  Administered 2016-05-08 (×5): 80 ug via INTRAVENOUS

## 2016-05-08 MED ORDER — KETOROLAC TROMETHAMINE 30 MG/ML IJ SOLN
INTRAMUSCULAR | Status: DC | PRN
  Administered 2016-05-08: 30 mg

## 2016-05-08 MED ORDER — PROPOFOL 500 MG/50ML IV EMUL
INTRAVENOUS | Status: DC | PRN
  Administered 2016-05-08: 100 ug/kg/min via INTRAVENOUS

## 2016-05-08 MED ORDER — PHENYLEPHRINE HCL 10 MG/ML IJ SOLN
INTRAMUSCULAR | Status: AC
  Filled 2016-05-08: qty 1

## 2016-05-08 MED ORDER — FENTANYL CITRATE (PF) 100 MCG/2ML IJ SOLN
INTRAMUSCULAR | Status: AC
  Filled 2016-05-08: qty 2

## 2016-05-08 MED ORDER — BUPIVACAINE IN DEXTROSE 0.75-8.25 % IT SOLN
INTRATHECAL | Status: DC | PRN
  Administered 2016-05-08: 2 mL via INTRATHECAL

## 2016-05-08 MED ORDER — DIPHENHYDRAMINE HCL 12.5 MG/5ML PO ELIX: 25.0000 mg | ORAL_SOLUTION | Freq: Four times a day (QID) | ORAL | Status: DC | PRN

## 2016-05-08 MED ORDER — METHOCARBAMOL 500 MG PO TABS
500.0000 mg | ORAL_TABLET | Freq: Four times a day (QID) | ORAL | Status: DC | PRN
  Administered 2016-05-08 – 2016-05-10 (×6): 500 mg via ORAL
  Filled 2016-05-08 (×6): qty 1

## 2016-05-08 MED ORDER — CEFAZOLIN SODIUM-DEXTROSE 2-4 GM/100ML-% IV SOLN
INTRAVENOUS | Status: AC
  Filled 2016-05-08: qty 100

## 2016-05-08 MED ORDER — SODIUM CHLORIDE 0.9 % IV SOLN
INTRAVENOUS | Status: DC
  Administered 2016-05-08: 14:00:00 via INTRAVENOUS
  Filled 2016-05-08 (×5): qty 1000

## 2016-05-08 MED ORDER — KETOROLAC TROMETHAMINE 30 MG/ML IJ SOLN
INTRAMUSCULAR | Status: AC
  Filled 2016-05-08: qty 1

## 2016-05-08 MED ORDER — OXYCODONE HCL 5 MG PO TABS
5.0000 mg | ORAL_TABLET | ORAL | Status: DC | PRN
  Administered 2016-05-08: 5 mg via ORAL
  Administered 2016-05-08: 10 mg via ORAL
  Administered 2016-05-08: 5 mg via ORAL
  Administered 2016-05-08: 10 mg via ORAL
  Administered 2016-05-09: 5 mg via ORAL
  Administered 2016-05-09 (×2): 10 mg via ORAL
  Administered 2016-05-09 (×4): 5 mg via ORAL
  Administered 2016-05-10: 10 mg via ORAL
  Administered 2016-05-10: 5 mg via ORAL
  Administered 2016-05-10: 10 mg via ORAL
  Filled 2016-05-08: qty 1
  Filled 2016-05-08 (×3): qty 2
  Filled 2016-05-08: qty 1
  Filled 2016-05-08: qty 2
  Filled 2016-05-08 (×3): qty 1
  Filled 2016-05-08 (×4): qty 2
  Filled 2016-05-08: qty 1

## 2016-05-08 MED ORDER — PANTOPRAZOLE SODIUM 40 MG PO TBEC
40.0000 mg | DELAYED_RELEASE_TABLET | Freq: Every day | ORAL | Status: DC
  Administered 2016-05-08 – 2016-05-10 (×3): 40 mg via ORAL
  Filled 2016-05-08 (×3): qty 1

## 2016-05-08 MED ORDER — PHENOL 1.4 % MT LIQD
1.0000 | OROMUCOSAL | Status: DC | PRN
  Filled 2016-05-08: qty 177

## 2016-05-08 MED ORDER — ONDANSETRON HCL 4 MG/2ML IJ SOLN: 4.0000 mg | Freq: Once | INTRAMUSCULAR | Status: DC | PRN

## 2016-05-08 MED ORDER — ONDANSETRON HCL 4 MG/2ML IJ SOLN: 4.0000 mg | Freq: Four times a day (QID) | INTRAMUSCULAR | Status: DC | PRN

## 2016-05-08 MED ORDER — SODIUM CHLORIDE 0.9 % IR SOLN
Status: DC | PRN
  Administered 2016-05-08: 1000 mL

## 2016-05-08 MED ORDER — BUPIVACAINE HCL (PF) 0.25 % IJ SOLN
INTRAMUSCULAR | Status: AC
  Filled 2016-05-08: qty 30

## 2016-05-08 MED ORDER — DEXAMETHASONE SODIUM PHOSPHATE 10 MG/ML IJ SOLN
10.0000 mg | Freq: Once | INTRAMUSCULAR | Status: AC
  Administered 2016-05-09: 10 mg via INTRAVENOUS
  Filled 2016-05-08: qty 1

## 2016-05-08 MED ORDER — FERROUS SULFATE 325 (65 FE) MG PO TABS
325.0000 mg | ORAL_TABLET | Freq: Three times a day (TID) | ORAL | Status: DC
  Administered 2016-05-09 – 2016-05-10 (×5): 325 mg via ORAL
  Filled 2016-05-08 (×5): qty 1

## 2016-05-08 MED ORDER — LACTATED RINGERS IV SOLN
INTRAVENOUS | Status: DC
  Administered 2016-05-08: 11:00:00 via INTRAVENOUS
  Administered 2016-05-08: 1000 mL via INTRAVENOUS

## 2016-05-08 MED ORDER — MIDAZOLAM HCL 2 MG/2ML IJ SOLN
INTRAMUSCULAR | Status: AC
  Filled 2016-05-08: qty 2

## 2016-05-08 MED ORDER — MENTHOL 3 MG MT LOZG: 1.0000 | LOZENGE | OROMUCOSAL | Status: DC | PRN

## 2016-05-08 MED ORDER — ALUM & MAG HYDROXIDE-SIMETH 200-200-20 MG/5ML PO SUSP: 30.0000 mL | ORAL | Status: DC | PRN

## 2016-05-08 MED ORDER — PROPOFOL 10 MG/ML IV BOLUS
INTRAVENOUS | Status: AC
  Filled 2016-05-08: qty 20

## 2016-05-08 MED ORDER — DOCUSATE SODIUM 100 MG PO CAPS
100.0000 mg | ORAL_CAPSULE | Freq: Two times a day (BID) | ORAL | Status: DC
  Administered 2016-05-08 – 2016-05-10 (×5): 100 mg via ORAL
  Filled 2016-05-08 (×5): qty 1

## 2016-05-08 MED ORDER — DEXTROSE 5 % IV SOLN
500.0000 mg | Freq: Four times a day (QID) | INTRAVENOUS | Status: DC | PRN
  Filled 2016-05-08: qty 5

## 2016-05-08 MED ORDER — STERILE WATER FOR IRRIGATION IR SOLN
Status: DC | PRN
  Administered 2016-05-08: 2000 mL

## 2016-05-08 MED ORDER — POLYETHYLENE GLYCOL 3350 17 G PO PACK
17.0000 g | PACK | Freq: Two times a day (BID) | ORAL | Status: DC
  Administered 2016-05-09 – 2016-05-10 (×3): 17 g via ORAL
  Filled 2016-05-08 (×3): qty 1

## 2016-05-08 MED ORDER — SODIUM CHLORIDE 0.9 % IV SOLN
1000.0000 mg | INTRAVENOUS | Status: AC
  Administered 2016-05-08: 1000 mg via INTRAVENOUS
  Filled 2016-05-08: qty 1100

## 2016-05-08 MED ORDER — ONDANSETRON HCL 4 MG/2ML IJ SOLN
INTRAMUSCULAR | Status: DC | PRN
  Administered 2016-05-08: 4 mg via INTRAVENOUS

## 2016-05-08 MED ORDER — FENTANYL CITRATE (PF) 100 MCG/2ML IJ SOLN: 25.0000 ug | INTRAMUSCULAR | Status: DC | PRN

## 2016-05-08 MED ORDER — ONDANSETRON HCL 4 MG/2ML IJ SOLN
INTRAMUSCULAR | Status: AC
  Filled 2016-05-08: qty 2

## 2016-05-08 SURGICAL SUPPLY — 56 items
BAG ZIPLOCK 12X15 (MISCELLANEOUS) ×6 IMPLANT
BANDAGE ACE 4X5 VEL STRL LF (GAUZE/BANDAGES/DRESSINGS) ×3 IMPLANT
BANDAGE ACE 6X5 VEL STRL LF (GAUZE/BANDAGES/DRESSINGS) ×3 IMPLANT
BLADE SAG 18X100X1.27 (BLADE) ×3 IMPLANT
BLADE SAW SGTL 13.0X1.19X90.0M (BLADE) ×3 IMPLANT
CAP KNEE TOTAL 3 SIGMA ×3 IMPLANT
CEMENT HV SMART SET (Cement) ×6 IMPLANT
CLOTH BEACON ORANGE TIMEOUT ST (SAFETY) ×3 IMPLANT
CUFF TOURN SGL QUICK 34 (TOURNIQUET CUFF) ×2
CUFF TRNQT CYL 34X4X40X1 (TOURNIQUET CUFF) ×1 IMPLANT
DECANTER SPIKE VIAL GLASS SM (MISCELLANEOUS) ×3 IMPLANT
DERMABOND ADVANCED (GAUZE/BANDAGES/DRESSINGS) ×2
DERMABOND ADVANCED .7 DNX12 (GAUZE/BANDAGES/DRESSINGS) ×1 IMPLANT
DRAPE U-SHAPE 47X51 STRL (DRAPES) ×3 IMPLANT
DRSG AQUACEL AG ADV 3.5X10 (GAUZE/BANDAGES/DRESSINGS) ×3 IMPLANT
DRSG TEGADERM 4X4.75 (GAUZE/BANDAGES/DRESSINGS) ×3 IMPLANT
DURAPREP 26ML APPLICATOR (WOUND CARE) ×6 IMPLANT
ELECT REM PT RETURN 9FT ADLT (ELECTROSURGICAL) ×3
ELECTRODE REM PT RTRN 9FT ADLT (ELECTROSURGICAL) ×1 IMPLANT
EVACUATOR 1/8 PVC DRAIN (DRAIN) ×3 IMPLANT
GAUZE SPONGE 2X2 8PLY STRL LF (GAUZE/BANDAGES/DRESSINGS) ×1 IMPLANT
GLOVE BIOGEL PI IND STRL 7.5 (GLOVE) ×3 IMPLANT
GLOVE BIOGEL PI IND STRL 8 (GLOVE) ×2 IMPLANT
GLOVE BIOGEL PI INDICATOR 7.5 (GLOVE) ×6
GLOVE BIOGEL PI INDICATOR 8 (GLOVE) ×4
GLOVE ECLIPSE 8.0 STRL XLNG CF (GLOVE) ×6 IMPLANT
GLOVE SURG ORTHO 9.0 STRL STRW (GLOVE) ×3 IMPLANT
GLOVE SURG SS PI 7.0 STRL IVOR (GLOVE) ×3 IMPLANT
GLOVE SURG SS PI 7.5 STRL IVOR (GLOVE) ×6 IMPLANT
GOWN STRL REUS W/TWL XL LVL3 (GOWN DISPOSABLE) ×12 IMPLANT
HANDPIECE INTERPULSE COAX TIP (DISPOSABLE) ×2
IMMOBILIZER KNEE 20 (SOFTGOODS) ×3
IMMOBILIZER KNEE 20 THIGH 36 (SOFTGOODS) ×1 IMPLANT
PACK TOTAL KNEE CUSTOM (KITS) ×3 IMPLANT
POSITIONER SURGICAL ARM (MISCELLANEOUS) ×3 IMPLANT
SET HNDPC FAN SPRY TIP SCT (DISPOSABLE) ×1 IMPLANT
SET PAD KNEE POSITIONER (MISCELLANEOUS) ×3 IMPLANT
SPONGE GAUZE 2X2 STER 10/PKG (GAUZE/BANDAGES/DRESSINGS) ×2
SPONGE SURGIFOAM ABS GEL 100 (HEMOSTASIS) ×3 IMPLANT
STOCKINETTE 6  STRL (DRAPES) ×2
STOCKINETTE 6 STRL (DRAPES) ×1 IMPLANT
SUCTION FRAZIER HANDLE 12FR (TUBING) ×2
SUCTION TUBE FRAZIER 12FR DISP (TUBING) ×1 IMPLANT
SUT BONE WAX W31G (SUTURE) IMPLANT
SUT MNCRL AB 3-0 PS2 18 (SUTURE) ×3 IMPLANT
SUT VIC AB 1 CT1 27 (SUTURE) ×8
SUT VIC AB 1 CT1 27XBRD ANTBC (SUTURE) ×4 IMPLANT
SUT VIC AB 2-0 CT1 27 (SUTURE) ×4
SUT VIC AB 2-0 CT1 TAPERPNT 27 (SUTURE) ×2 IMPLANT
SUT VLOC 180 0 24IN GS25 (SUTURE) ×3 IMPLANT
SYR 50ML LL SCALE MARK (SYRINGE) ×3 IMPLANT
TAPE STRIPS DRAPE STRL (GAUZE/BANDAGES/DRESSINGS) ×3 IMPLANT
TOWER CARTRIDGE SMART MIX (DISPOSABLE) ×3 IMPLANT
TRAY FOLEY CATH SILVER 14FR (SET/KITS/TRAYS/PACK) ×3 IMPLANT
WRAP KNEE MAXI GEL POST OP (GAUZE/BANDAGES/DRESSINGS) ×3 IMPLANT
YANKAUER SUCT BULB TIP 10FT TU (MISCELLANEOUS) ×3 IMPLANT

## 2016-05-08 NOTE — Interval H&P Note (Signed)
History and Physical Interval Note:  05/08/2016 9:53 AM  Candice Richardson  has presented today for surgery, with the diagnosis of LEFT KNEE OA  The various methods of treatment have been discussed with the patient and family. After consideration of risks, benefits and other options for treatment, the patient has consented to  Procedure(s): LEFT TOTAL KNEE ARTHROPLASTY (Left) as a surgical intervention .  The patient's history has been reviewed, patient examined, no change in status, stable for surgery.  I have reviewed the patient's chart and labs.  Questions were answered to the patient's satisfaction.     Yaser Harvill ANDREW

## 2016-05-08 NOTE — Transfer of Care (Signed)
Immediate Anesthesia Transfer of Care Note  Patient: Candice Richardson  Procedure(s) Performed: Procedure(s): LEFT TOTAL KNEE ARTHROPLASTY (Left)  Patient Location: PACU  Anesthesia Type:Spinal  Level of Consciousness:  sedated, patient cooperative and responds to stimulation  Airway & Oxygen Therapy:Patient Spontanous Breathing and Patient connected to face mask oxgen  Post-op Assessment:  Report given to PACU RN and Post -op Vital signs reviewed and stable  Post vital signs:  Reviewed and stable  Last Vitals:  Vitals:   05/08/16 0822  BP: (!) 156/56  Pulse: 67  Resp: 18  Temp: 36.4 C    Complications: No apparent anesthesia complications

## 2016-05-08 NOTE — Evaluation (Signed)
Physical Therapy Evaluation Patient Details Name: Candice PentaDianne Traber MRN: [id number] DOB: 05-Aug-1945 Today's Date: 05/08/2016   History of Present Illness  Pt s/p L TKR   Clinical Impression  Pt s/p L TKR presents with decreased L LE strength/ROM and post op pain limiting functional mobility.  Pt should progress to dc home with family assist and HHPT follow up.    Follow Up Recommendations Home health PT    Equipment Recommendations  Rolling walker with 5" wheels    Recommendations for Other Services OT consult     Precautions / Restrictions Precautions Precautions: Knee;Fall Required Braces or Orthoses: Knee Immobilizer - Left Knee Immobilizer - Left: Discontinue once straight leg raise with < 10 degree lag Restrictions Weight Bearing Restrictions: No Other Position/Activity Restrictions: WBAT      Mobility  Bed Mobility Overal bed mobility: Needs Assistance Bed Mobility: Supine to Sit;Sit to Supine     Supine to sit: Min assist Sit to supine: Min assist   General bed mobility comments: cues for sequence and use of R LE to self assist  Transfers Overall transfer level: Needs assistance Equipment used: Rolling walker (2 wheeled) Transfers: Sit to/from Stand Sit to Stand: Min assist         General transfer comment: cues for LE management and use of UEs to self assist  Ambulation/Gait Ambulation/Gait assistance: Min assist Ambulation Distance (Feet): 34 Feet Assistive device: Rolling walker (2 wheeled) Gait Pattern/deviations: Step-to pattern;Decreased step length - right;Decreased step length - left;Shuffle;Trunk flexed Gait velocity: decr Gait velocity interpretation: Below normal speed for age/gender General Gait Details: cues for sequence, posture and position from [employer]  Stairs            Wheelchair Mobility    Modified Rankin (Stroke Patients Only)       Balance                                             Pertinent  Vitals/Pain Pain Assessment: 0-10 Pain Score: 4  Pain Location: L knee Pain Descriptors / Indicators: Aching;Sore Pain Intervention(s): Limited activity within patient's tolerance;Monitored during session;Premedicated before session;Ice applied    Home Living Family/patient expects to be discharged to:: Private residence Living Arrangements: Children Available Help at Discharge: Family Type of Home: House Home Access: Stairs to enter   Secretary/administratorntrance Stairs-Number of Steps: 2 Home Layout: One level Home Equipment: None      Prior Function Level of Independence: Independent               Hand Dominance        Extremity/Trunk Assessment   Upper Extremity Assessment Upper Extremity Assessment: Overall WFL for tasks assessed    Lower Extremity Assessment Lower Extremity Assessment: LLE deficits/detail    Cervical / Trunk Assessment Cervical / Trunk Assessment: Normal  Communication   Communication: No difficulties  Cognition Arousal/Alertness: Awake/alert Behavior During Therapy: WFL for tasks assessed/performed Overall Cognitive Status: Within Functional Limits for tasks assessed                      General Comments      Exercises Total Joint Exercises Ankle Circles/Pumps: AROM;Both;15 reps;Supine   Assessment/Plan    PT Assessment Patient needs continued PT services  PT Problem List Decreased range of motion;Decreased strength;Decreased activity tolerance;Decreased mobility;Decreased knowledge of use of DME;Pain  PT Treatment Interventions DME instruction;Gait training;Stair training;Functional mobility training;Therapeutic activities;Therapeutic exercise;Patient/family education    PT Goals (Current goals can be found in the Care Plan section)  Acute Rehab PT Goals Patient Stated Goal: Regain IND ASAP PT Goal Formulation: With patient Time For Goal Achievement: 05/13/16 Potential to Achieve Goals: Good    Frequency 7X/week    Barriers to discharge        Co-evaluation               End of Session Equipment Utilized During Treatment: Gait belt;Left knee immobilizer Activity Tolerance: Patient tolerated treatment well Patient left: in bed;with call bell/phone within reach;with bed alarm set;with family/visitor present Nurse Communication: Mobility status         Time: [id number] PT Time Calculation (min) (ACUTE ONLY): 28 min   Charges:   PT Evaluation $PT Eval Low Complexity: 1 Procedure PT Treatments $Gait Training: 8-22 mins   PT G Codes:        Reuben Knoblock 05/08/2016, 6:08 PM

## 2016-05-08 NOTE — Anesthesia Procedure Notes (Signed)
Spinal  Patient location during procedure: OR Start time: 05/08/2016 10:23 AM End time: 05/08/2016 10:25 AM Staffing Resident/CRNA: Harle Stanford R Performed: resident/CRNA  Preanesthetic Checklist Completed: patient identified, site marked, surgical consent, pre-op evaluation, timeout performed, IV checked, risks and benefits discussed and monitors and equipment checked Spinal Block Patient position: sitting Prep: Betadine Patient monitoring: heart rate Approach: midline Location: L3-4 Injection technique: single-shot Needle Needle type: Spinocan  Needle gauge: 24 G Needle length: 10 cm Needle insertion depth: 6 cm Assessment Sensory level: T6 Additional Notes Timeout. Spinal kit date checked. SAB without difficulty

## 2016-05-08 NOTE — Anesthesia Preprocedure Evaluation (Addendum)
Anesthesia Evaluation  Patient identified by MRN, date of birth, ID band Patient awake    Reviewed: Allergy & Precautions, NPO status , Patient's Chart, lab work & pertinent test results, reviewed documented beta blocker date and time   Airway Mallampati: II  TM Distance: >3 FB Neck ROM: Full    Dental  (+) Teeth Intact, Dental Advisory Given, Caps, Implants   Pulmonary neg pulmonary ROS,    Pulmonary exam normal breath sounds clear to auscultation       Cardiovascular hypertension, Pt. on medications and Pt. on home beta blockers Normal cardiovascular exam Rhythm:Regular Rate:Normal  HLD   Neuro/Psych negative neurological ROS  negative psych ROS   GI/Hepatic Neg liver ROS, GERD  Medicated,  Endo/Other  negative endocrine ROS  Renal/GU negative Renal ROS     Musculoskeletal  (+) Arthritis , Osteoarthritis,    Abdominal   Peds  Hematology negative hematology ROS (+) Plt 276k   Anesthesia Other Findings Day of surgery medications reviewed with the patient.  Reproductive/Obstetrics                            Anesthesia Physical Anesthesia Plan  ASA: II  Anesthesia Plan: Spinal and MAC   Post-op Pain Management:    Induction: Intravenous  Airway Management Planned: Simple Face Mask  Additional Equipment:   Intra-op Plan:   Post-operative Plan:   Informed Consent: I have reviewed the patients History and Physical, chart, labs and discussed the procedure including the risks, benefits and alternatives for the proposed anesthesia with the patient or authorized representative who has indicated his/her understanding and acceptance.   Dental advisory given  Plan Discussed with: CRNA, Anesthesiologist and Surgeon  Anesthesia Plan Comments: (Discussed risks and benefits of and differences between spinal and general. Discussed risks of spinal including headache, backache, failure,  bleeding, infection, and nerve damage. Patient consents to spinal. Questions answered. Coagulation studies and platelet count acceptable.)        Anesthesia Quick Evaluation

## 2016-05-08 NOTE — Anesthesia Postprocedure Evaluation (Signed)
Anesthesia Post Note  Patient: Jeris PentaDianne Mackiewicz  Procedure(s) Performed: Procedure(s) (LRB): LEFT TOTAL KNEE ARTHROPLASTY (Left)  Patient location during evaluation: PACU Anesthesia Type: Spinal Level of consciousness: oriented and awake and alert Pain management: pain level controlled Vital Signs Assessment: post-procedure vital signs reviewed and stable Respiratory status: spontaneous breathing, respiratory function stable and patient connected to nasal cannula oxygen Cardiovascular status: blood pressure returned to baseline and stable Postop Assessment: no headache, no backache, spinal receding, no signs of nausea or vomiting and patient able to bend at knees Anesthetic complications: no    Last Vitals:  Vitals:   05/08/16 1444 05/08/16 1540  BP: (!) 122/49 (!) 122/56  Pulse: 62 63  Resp: 14 14  Temp:  36.3 C    Last Pain:  Vitals:   05/08/16 1557  TempSrc:   PainSc: 2                  Cecile HearingStephen Edward Turk

## 2016-05-08 NOTE — Op Note (Signed)
DATE OF SURGERY:  05/08/2016  TIME: 11:56 AM  PATIENT NAME:  Candice Richardson    AGE: 70 y.o.   PRE-OPERATIVE DIAGNOSIS:  LEFT KNEE OA  POST-OPERATIVE DIAGNOSIS:  LEFT KNEE OA  PROCEDURE:  Procedure(s): LEFT TOTAL KNEE ARTHROPLASTY  SURGEON:  Antuane Eastridge ANDREW  ASSISTANT: Mr Leilani AbleSteve Chabon , PA-C, present and scrubbed throughout the case, critical for assistance with exposure, retraction, instrumentation, and closure.  OPERATIVE IMPLANTS: Depuy PFC Sigma Rotating Platform.  Femur size 3, Tibia size 3, Patella size 32 3-peg oval button, with a 10 mm polyethylene insert.   PREOPERATIVE INDICATIONS:   Candice Richardson is a 70 y.o. year old female with end stage bone on bone arthritis of the knee who failed conservative treatment and elected for Total Knee Arthroplasty.   The risks, benefits, and alternatives were discussed at length including but not limited to the risks of infection, bleeding, nerve injury, stiffness, blood clots, the need for revision surgery, cardiopulmonary complications, among others, and they were willing to proceed.  OPERATIVE DESCRIPTION:  The patient was brought to the operative room and placed in a supine position.  Spinal anesthesia was administered.  IV antibiotics were given.  The lower extremity was prepped and draped in the usual sterile fashion.  Time out was performed.  The leg was elevated and exsanguinated and the tourniquet was inflated.  Anterior quadriceps tendon splitting approach was performed.  The patella was retracted and osteophytes were removed.  The anterior horn of the medial and lateral meniscus was removed and cruciate ligaments resected.   The distal femur was opened with the drill and the intramedullary distal femoral cutting jig was utilized, set at 5 degrees resecting 10 mm off the distal femur.  Care was taken to protect the collateral ligaments.  The distal femoral sizing jig was applied, taking care to avoid notching.  Then the  4-in-1 cutting jig was applied and the anterior and posterior femur was cut, along with the chamfer cuts.    Then the extramedullary tibial cutting jig was utilized making the appropriate cut using the anterior tibial crest as a reference building in appropriate posterior slope.  Care was taken during the cut to protect the medial and collateral ligaments.  The proximal tibia was removed along with the posterior horns of the menisci.   The posterior medial femoral osteophytes and posterior lateral femoral osteophytes were removed.    The flexion gap was then measured and was symmetric with the extension gap, measured at 10.  I completed the distal femoral preparation using the appropriate jig to prepare the box.  The patella was then measured, and cut with the saw.    The proximal tibia sized and prepared accordingly with the reamer and the punch, and then all components were trialed with the trial insert.  The knee was found to have excellent balance and full motion.    The above named components were then cemented into place and all excess cement was removed.  The trial polyethylene component was in place during cementation, and then was exchanged for the real polyethylene component.    The knee was easily taken through a range of motion and the patella tracked well and the knee irrigated copiously and the parapatellar and subcutaneous tissue closed with vicryl, and monocryl with steri strips for the skin.  The arthrotomy was closed at 90 of flexion. The wounds were dressed with sterile gauze and the tourniquet released and the patient was awakened and returned to the PACU in  stable and satisfactory condition.  There were no complications.  Total tourniquet time was 80 minutes.

## 2016-05-09 LAB — BASIC METABOLIC PANEL
Anion gap: 9 (ref 5–15)
BUN: 13 mg/dL (ref 6–20)
CALCIUM: 8.5 mg/dL — AB (ref 8.9–10.3)
CHLORIDE: 104 mmol/L (ref 101–111)
CO2: 23 mmol/L (ref 22–32)
CREATININE: 0.56 mg/dL (ref 0.44–1.00)
GFR calc Af Amer: >60 mL/min (ref >60)
GFR calc non Af Amer: >60 mL/min (ref >60)
GLUCOSE: 134 mg/dL — AB (ref 65–99)
Potassium: 4.1 mmol/L (ref 3.5–5.1)
Sodium: 136 mmol/L (ref 135–145)

## 2016-05-09 LAB — CBC
HEMATOCRIT: 35.3 % — AB (ref 36.0–46.0)
Hemoglobin: 12.4 g/dL (ref 12.0–15.0)
MCH: 32.8 pg (ref 26.0–34.0)
MCHC: 35.1 g/dL (ref 30.0–36.0)
MCV: 93.4 fL (ref 78.0–100.0)
Platelets: 234 10*3/uL (ref 150–400)
RBC: 3.78 MIL/uL — ABNORMAL LOW (ref 3.87–5.11)
RDW: 12.5 % (ref 11.5–15.5)
WBC: 19.3 10*3/uL — ABNORMAL HIGH (ref 4.0–10.5)

## 2016-05-09 NOTE — Progress Notes (Signed)
   Subjective: 1 Day Post-Op Procedure(s) (LRB): LEFT TOTAL KNEE ARTHROPLASTY (Left)  Pt doing well Pain is mild currently  Ready for PT/OT today Denies any new symptoms or issues Patient reports pain as mild.  Objective:   VITALS:   Vitals:   05/09/16 0140 05/09/16 0445  BP: (!) 119/49 120/61  Pulse: 66 65  Resp: 20 16  Temp: 97.7 F (36.5 C) 98 F (36.7 C)    Left knee dressing intact Drain pulled nv intact distally No rashes or edema  LABS  Recent Labs  05/08/16 1523 05/09/16 0423  HGB 14.1 12.4  HCT 40.5 35.3*  WBC 10.6* 19.3*  PLT 242 234     Recent Labs  05/08/16 1523 05/09/16 0423  NA  --  136  K  --  4.1  BUN  --  13  CREATININE 0.66 0.56  GLUCOSE  --  134*     Assessment/Plan: 1 Day Post-Op Procedure(s) (LRB): LEFT TOTAL KNEE ARTHROPLASTY (Left) PT/OT today Plan for d/c tomorrow Pulmonary toilet    Alphonsa OverallBrad Dixon, MPAS, PA-C  05/09/2016, 7:41 AM

## 2016-05-09 NOTE — Progress Notes (Signed)
Physical Therapy Treatment Patient Details Name: Candice PentaDianne Richardson MRN: [id number] DOB: 02-20-1946 Today's Date: 05/09/2016    History of Present Illness Pt s/p L TKR     PT Comments    Progressing; incr gait tol today, no dizziness  Follow Up Recommendations  Home health PT     Equipment Recommendations  Rolling walker with 5" wheels    Recommendations for Other Services       Precautions / Restrictions Precautions Precautions: Knee;Fall Precaution Comments: IND SLRs, KI not utilized Knee Immobilizer - Left: Discontinue once straight leg raise with < 10 degree lag Restrictions Other Position/Activity Restrictions: WBAT    Mobility  Bed Mobility Overal bed mobility: Needs Assistance Bed Mobility: Supine to Sit     Supine to sit: Supervision;Modified independent (Device/Increase time)     General bed mobility comments: for safety, incr time  Transfers Overall transfer level: Needs assistance Equipment used: Rolling walker (2 wheeled) Transfers: Sit to/from Stand Sit to Stand: Supervision         General transfer comment: cues for LE management and use of UEs to self assist  Ambulation/Gait Ambulation/Gait assistance: Min guard Ambulation Distance (Feet): 60 Feet (x2) Assistive device: Rolling walker (2 wheeled) Gait Pattern/deviations: Step-to pattern;Decreased step length - right;Decreased step length - left;Trunk flexed     General Gait Details: cues for sequence, posture and position from [facility]   Stairs            Wheelchair Mobility    Modified Rankin (Stroke Patients Only)       Balance                                    Cognition Arousal/Alertness: Awake/alert Behavior During Therapy: WFL for tasks assessed/performed Overall Cognitive Status: Within Functional Limits for tasks assessed                      Exercises Total Joint Exercises Knee Flexion: AAROM;Left;5 reps;Seated    General Comments         Pertinent Vitals/Pain Pain Assessment: 0-10 Pain Score: 3  Pain Location: L knee Pain Descriptors / Indicators: Aching;Discomfort;Sore Pain Intervention(s): Limited activity within patient's tolerance;Monitored during session;Ice applied    Home Living                      Prior Function            PT Goals (current goals can now be found in the care plan section) Acute Rehab PT Goals Patient Stated Goal: Regain IND ASAP PT Goal Formulation: With patient Time For Goal Achievement: 05/13/16 Potential to Achieve Goals: Good Progress towards PT goals: Progressing toward goals    Frequency    7X/week      PT Plan Current plan remains appropriate    Co-evaluation             End of Session Equipment Utilized During Treatment: Gait belt Activity Tolerance: Patient tolerated treatment well Patient left: in bed;with call bell/phone within reach;with family/visitor present     Time: 1440-1456 PT Time Calculation (min) (ACUTE ONLY): 16 min  Charges:  $Gait Training: 8-22 mins                    G Codes:      Candice Richardson 05/09/2016, 3:06 PM

## 2016-05-09 NOTE — Evaluation (Signed)
Occupational Therapy Evaluation Patient Details Name: Candice PentaDianne Richardson MRN: [id number] DOB: 01/18/1946 Today's Date: 05/09/2016    History of Present Illness Pt s/p L TKR    Clinical Impression   This [age]year old female was admitted for the above sx. She will benefit from continued OT in acute setting to complete education on AE and shower transfers.     Follow Up Recommendations  Supervision/Assistance - 24 hour    Equipment Recommendations  None recommended by OT    Recommendations for Other Services       Precautions / Restrictions Precautions Precautions: Knee;Fall Required Braces or Orthoses: Knee Immobilizer - Left Knee Immobilizer - Left: Discontinue once straight leg raise with < 10 degree lag Restrictions Other Position/Activity Restrictions: WBAT      Mobility Bed Mobility         Supine to sit: Min assist Sit to supine: Min assist   General bed mobility comments: assist for LLE  Transfers   Equipment used: Rolling walker (2 wheeled)   Sit to Stand: Min guard         General transfer comment: for safety.  Cues for UE/LE placement    Balance                                            ADL Overall ADL's : Needs assistance/impaired     Grooming: Oral care;Supervision/safety;Standing   Upper Body Bathing: Min guard;Standing   Lower Body Bathing: Minimal assistance;Sit to/from stand   Upper Body Dressing : Set up;Sitting   Lower Body Dressing: Minimal assistance;Sit to/from stand (underwear)   Toilet Transfer: Min guard;Ambulation;Comfort height toilet;RW   Toileting- ArchitectClothing Manipulation and Hygiene: Min guard;Sit to/from stand         General ADL Comments: ambulated to bathroom and performed ADL/toileting.  pt felt a little lightheaded when standing after ADL:  did not practice shower transfer this session     Vision     Perception     Praxis      Pertinent Vitals/Pain Pain Score: 3  Pain Location: L  kne Pain Descriptors / Indicators: Aching Pain Intervention(s): Limited activity within patient's tolerance;Monitored during session;Premedicated before session;Repositioned;Ice applied     Hand Dominance     Extremity/Trunk Assessment Upper Extremity Assessment Upper Extremity Assessment: Overall WFL for tasks assessed           Communication     Cognition Arousal/Alertness: Awake/alert Behavior During Therapy: WFL for tasks assessed/performed Overall Cognitive Status: Within Functional Limits for tasks assessed                     General Comments       Exercises       Shoulder Instructions      Home Living Family/patient expects to be discharged to:: Private residence Living Arrangements: Children Available Help at Discharge: Family               Bathroom Shower/Tub: Walk-in Soil scientistshower   Bathroom Toilet: Handicapped height     Home Equipment: Shower seat;Grab bars - toilet          Prior Functioning/Environment Level of Independence: Independent                 OT Problem List: Decreased activity tolerance;Pain;Decreased knowledge of use of DME or AE   OT Treatment/Interventions: Self-care/ADL training;DME and/or AE instruction;Patient/family  education    OT Goals(Current goals can be found in the care plan section) Acute Rehab OT Goals Patient Stated Goal: Regain IND ASAP OT Goal Formulation: With patient Time For Goal Achievement: 05/11/16 Potential to Achieve Goals: Good ADL Goals Pt Will Transfer to Toilet: with supervision;ambulating (comfort height with grab bar) Pt Will Perform Tub/Shower Transfer: Shower transfer;with min guard assist;ambulating;shower seat Additional ADL Goal #1: pt will verbalize understanding of AE for adls vs demonstrate independently  OT Frequency: Min 2X/week   Barriers to D/C:            Co-evaluation              End of Session    Activity Tolerance: Patient tolerated treatment  well Patient left: in bed;with call bell/phone within reach   Time: 0738-0808 OT Time Calculation (min): 30 min Charges:  OT General Charges $OT Visit: 1 Procedure OT Evaluation $OT Eval Low Complexity: 1 Procedure OT Treatments $Self Care/Home Management : 8-22 mins G-Codes:    Candice Richardson 05/09/2016, 8:39 AM  Candice Richardson, OTR/L [phone] 05/09/2016

## 2016-05-09 NOTE — Progress Notes (Signed)
Physical Therapy Treatment Patient Details Name: Candice PentaDianne Richardson MRN: [id number] DOB: [birth year] Today's Date: 05/09/2016    History of Present Illness Pt s/p L TKR     PT Comments    Pt is doing well, gait distance limited by dizziness;   Follow Up Recommendations  Home health PT     Equipment Recommendations  Rolling walker with 5" wheels    Recommendations for Other Services       Precautions / Restrictions Precautions Precautions: Knee;Fall Precaution Comments: IND SLRs, KI not utilized Knee Immobilizer - Left: Discontinue once straight leg raise with < 10 degree lag Restrictions Weight Bearing Restrictions: No Other Position/Activity Restrictions: WBAT    Mobility  Bed Mobility Overal bed mobility: Needs Assistance Bed Mobility: Supine to Sit     Supine to sit: Supervision Sit to supine: Min assist   General bed mobility comments: for safety, incr time  Transfers Overall transfer level: Needs assistance Equipment used: Rolling walker (2 wheeled) Transfers: Sit to/from Stand Sit to Stand: Min guard         General transfer comment: cues for LE management and use of UEs to self assist  Ambulation/Gait Ambulation/Gait assistance: Min guard Ambulation Distance (Feet): 26 Feet Assistive device: Rolling walker (2 wheeled) Gait Pattern/deviations: Step-to pattern;Decreased step length - right;Decreased step length - left;Trunk flexed Gait velocity: decr   General Gait Details: cues for sequence, posture and position from RW (distance limited by dizziness (BP soft) RN is aware)   Careers information officertairs            Wheelchair Mobility    Modified Rankin (Stroke Patients Only)       Balance                                    Cognition Arousal/Alertness: Awake/alert Behavior During Therapy: WFL for tasks assessed/performed Overall Cognitive Status: Within Functional Limits for tasks assessed                      Exercises Total  Joint Exercises Ankle Circles/Pumps: AROM;Both;15 reps;Supine Quad Sets: AROM;Both;10 reps Heel Slides: AAROM;Left;10 reps Straight Leg Raises: AROM;Left;5 reps    General Comments        Pertinent Vitals/Pain Pain Assessment: 0-10 Pain Score: 5  Pain Location: L knee Pain Descriptors / Indicators: Aching Pain Intervention(s): Limited activity within patient's tolerance;Monitored during session;Premedicated before session;Ice applied;Repositioned    Home Living                      Prior Function            PT Goals (current goals can now be found in the care plan section) Acute Rehab PT Goals Patient Stated Goal: Regain IND ASAP PT Goal Formulation: With patient Time For Goal Achievement: 05/13/16 Potential to Achieve Goals: Good Progress towards PT goals: Progressing toward goals    Frequency    7X/week      PT Plan Current plan remains appropriate    Co-evaluation             End of Session   Activity Tolerance: Patient tolerated treatment well Patient left: in chair;with call bell/phone within reach     Time: 1038-1056 PT Time Calculation (min) (ACUTE ONLY): 18 min  Charges:  $Gait Training: 8-22 mins  G CodesDrucilla Chalet:      Candice Richardson 05/09/2016, 11:11 AM

## 2016-05-10 LAB — CBC
HEMATOCRIT: 32.8 % — AB (ref 36.0–46.0)
Hemoglobin: 11.6 g/dL — ABNORMAL LOW (ref 12.0–15.0)
MCH: 33.1 pg (ref 26.0–34.0)
MCHC: 35.4 g/dL (ref 30.0–36.0)
MCV: 93.7 fL (ref 78.0–100.0)
Platelets: 232 10*3/uL (ref 150–400)
RBC: 3.5 MIL/uL — ABNORMAL LOW (ref 3.87–5.11)
RDW: 12.9 % (ref 11.5–15.5)
WBC: 20.8 10*3/uL — ABNORMAL HIGH (ref 4.0–10.5)

## 2016-05-10 LAB — BASIC METABOLIC PANEL WITH GFR
Anion gap: 5 (ref 5–15)
BUN: 15 mg/dL (ref 6–20)
CO2: 27 mmol/L (ref 22–32)
Calcium: 8.5 mg/dL — ABNORMAL LOW (ref 8.9–10.3)
Chloride: 104 mmol/L (ref 101–111)
Creatinine, Ser: 0.57 mg/dL (ref 0.44–1.00)
GFR calc Af Amer: >60 mL/min
GFR calc non Af Amer: >60 mL/min
Glucose, Bld: 121 mg/dL — ABNORMAL HIGH (ref 65–99)
Potassium: 4 mmol/L (ref 3.5–5.1)
Sodium: 136 mmol/L (ref 135–145)

## 2016-05-10 MED ORDER — ASPIRIN EC 325 MG PO TBEC: 325.0000 mg | DELAYED_RELEASE_TABLET | Freq: Two times a day (BID) | ORAL | 0 refills | Status: AC

## 2016-05-10 MED ORDER — POLYETHYLENE GLYCOL 3350 17 G PO PACK: 17.0000 g | PACK | Freq: Two times a day (BID) | ORAL | 0 refills | Status: DC

## 2016-05-10 MED ORDER — ACETAMINOPHEN 325 MG PO TABS: 650.0000 mg | ORAL_TABLET | Freq: Four times a day (QID) | ORAL | Status: DC | PRN

## 2016-05-10 MED ORDER — ONDANSETRON HCL 4 MG PO TABS: 4.0000 mg | ORAL_TABLET | Freq: Four times a day (QID) | ORAL | 0 refills | Status: DC | PRN

## 2016-05-10 MED ORDER — OXYCODONE HCL 5 MG PO TABS: 5.0000 mg | ORAL_TABLET | ORAL | 0 refills | Status: DC | PRN

## 2016-05-10 MED ORDER — DOCUSATE SODIUM 100 MG PO CAPS: 100.0000 mg | ORAL_CAPSULE | Freq: Two times a day (BID) | ORAL | 0 refills | Status: DC

## 2016-05-10 MED ORDER — METHOCARBAMOL 500 MG PO TABS: 500.0000 mg | ORAL_TABLET | Freq: Four times a day (QID) | ORAL | 0 refills | Status: DC | PRN

## 2016-05-10 MED ORDER — FERROUS SULFATE 325 (65 FE) MG PO TABS: 325.0000 mg | ORAL_TABLET | Freq: Three times a day (TID) | ORAL | 3 refills | Status: DC

## 2016-05-10 NOTE — Progress Notes (Signed)
Physical Therapy Treatment Patient Details Name: Candice PentaDianne Richardson MRN: [id number] DOB: 12-Jan-1946 Today's Date: 05/10/2016    History of Present Illness Pt s/p L TKR     PT Comments    Pt doing very well, feels ready to go home;   Follow Up Recommendations  Home health PT     Equipment Recommendations  Rolling walker with 5" wheels    Recommendations for Other Services       Precautions / Restrictions Precautions Precautions: Knee;Fall Precaution Comments: IND SLRs, KI not utilized Restrictions Weight Bearing Restrictions: No Other Position/Activity Restrictions: WBAT    Mobility  Bed Mobility Overal bed mobility: Needs Assistance Bed Mobility: Supine to Sit;Sit to Supine     Supine to sit: Supervision;Modified independent (Device/Increase time) Sit to supine: Supervision;Modified independent (Device/Increase time)   General bed mobility comments: for safety, incr time  Transfers Overall transfer level: Needs assistance Equipment used: Rolling walker (2 wheeled) Transfers: Sit to/from Stand Sit to Stand: Supervision;Modified independent (Device/Increase time)         General transfer comment: pt self corrects hand placement  Ambulation/Gait Ambulation/Gait assistance: Supervision;Min guard Ambulation Distance (Feet): 100 Feet Assistive device: Rolling walker (2 wheeled) Gait Pattern/deviations: Step-to pattern;Decreased step length - right;Decreased step length - left;Trunk flexed Gait velocity: decr   General Gait Details: cues for gait progression   Stairs Stairs: Yes   Stair Management: One rail Right;One rail Left;Step to pattern;Sideways Number of Stairs: 3 General stair comments: cues for sequence  Wheelchair Mobility    Modified Rankin (Stroke Patients Only)       Balance                                    Cognition Arousal/Alertness: Awake/alert Behavior During Therapy: WFL for tasks assessed/performed Overall  Cognitive Status: Within Functional Limits for tasks assessed                      Exercises Total Joint Exercises Ankle Circles/Pumps: AROM;Both;15 reps;Supine Quad Sets: AROM;Both;10 reps Heel Slides: AAROM;Left;10 reps Hip ABduction/ADduction: AROM;Left;10 reps Straight Leg Raises: AROM;Strengthening;Left;10 reps Knee Flexion: AAROM;Left;5 reps;Seated    General Comments        Pertinent Vitals/Pain Pain Assessment: 0-10 Pain Score: 4  Pain Location: L knee Pain Descriptors / Indicators: Aching;Discomfort;Sore Pain Intervention(s): Limited activity within patient's tolerance;Monitored during session;Premedicated before session;Ice applied;Repositioned    Home Living                      Prior Function            PT Goals (current goals can now be found in the care plan section) Acute Rehab PT Goals Patient Stated Goal: Regain IND ASAP PT Goal Formulation: With patient Time For Goal Achievement: 05/13/16 Potential to Achieve Goals: Good Progress towards PT goals: Progressing toward goals    Frequency    7X/week      PT Plan Current plan remains appropriate    Co-evaluation             End of Session Equipment Utilized During Treatment: Gait belt Activity Tolerance: Patient tolerated treatment well Patient left: in bed;with call bell/phone within reach;with family/visitor present     Time: 8295-62131004-1032 PT Time Calculation (min) (ACUTE ONLY): 28 min  Charges:  $Gait Training: 8-22 mins $Therapeutic Exercise: 8-22 mins  G CodesDrucilla Chalet:      Candice Richardson 05/10/2016, 10:41 AM

## 2016-05-10 NOTE — Progress Notes (Signed)
Occupational Therapy Treatment Patient Details Name: Jeris PentaDianne Paulette MRN: [id number] DOB: 05-27-1945 Today's Date: 05/10/2016    History of present illness Pt s/p L TKR    OT comments  OT education complete. Pts son will A at home as needed  Follow Up Recommendations  Supervision/Assistance - 24 hour    Equipment Recommendations  None recommended by OT       Precautions / Restrictions Precautions Precautions: Knee;Fall Precaution Comments: IND SLRs, KI not utilized Restrictions Weight Bearing Restrictions: No Other Position/Activity Restrictions: WBAT       Mobility Bed Mobility Overal bed mobility: Needs Assistance Bed Mobility: Supine to Sit;Sit to Supine     Supine to sit: Supervision;Modified independent (Device/Increase time) Sit to supine: Supervision;Modified independent (Device/Increase time)   General bed mobility comments: for safety, incr time  Transfers Overall transfer level: Needs assistance Equipment used: Rolling walker (2 wheeled) Transfers: Sit to/from Stand Sit to Stand: Supervision;Modified independent (Device/Increase time)         General transfer comment: pt self corrects hand placement        ADL Overall ADL's : Needs assistance/impaired             Lower Body Bathing: Supervison/ safety;Sit to/from stand;Cueing for compensatory techniques;Cueing for sequencing       Lower Body Dressing: Supervision/safety;Sit to/from stand;Cueing for compensatory techniques;Cueing for sequencing   Toilet Transfer: Ambulation;Comfort height toilet;RW;Supervision/safety   Toileting- Clothing Manipulation and Hygiene: Supervision/safety;Sit to/from stand   Tub/ Shower Transfer: Supervision/safety                      Cognition   Behavior During Therapy: WFL for tasks assessed/performed Overall Cognitive Status: Within Functional Limits for tasks assessed                       Extremity/Trunk Assessment                   Shoulder Instructions       General Comments      Pertinent Vitals/ Pain       Pain Assessment: 0-10 Pain Score: 4  Pain Location: L knee Pain Descriptors / Indicators: Aching;Sore Pain Intervention(s): Monitored during session         Frequency  Min 2X/week        Progress Toward Goals  OT Goals(current goals can now be found in the care plan section)  Progress towards OT goals: Progressing toward goals  Acute Rehab OT Goals Patient Stated Goal: Regain IND ASAP  Plan      Co-evaluation                 End of Session     Activity Tolerance Patient tolerated treatment well   Patient Left in bed;with call bell/phone within reach   Nurse Communication          Time: 5621-30861036-1059 OT Time Calculation (min): 23 min  Charges: OT General Charges $OT Visit: 1 Procedure OT Treatments $Self Care/Home Management : 23-37 mins  Orena Cavazos, Metro KungLorraine D 05/10/2016, 12:03 PM

## 2016-05-10 NOTE — Progress Notes (Signed)
Discharged from floor via w/c for transport home by car. Spouse & belongings with pt. No changes in assessment. Candice Richardson  

## 2016-05-10 NOTE — Discharge Instructions (Signed)

## 2016-05-10 NOTE — Care Management Note (Signed)
Case Management Note  Patient Details  Name: Candice Richardson MRN: [id number] Date of Birth: Jul 29, 1945  Subjective/Objective:     S/p L TKR               Action/Plan: Discharge Planning: AVS reviewed: N[facility] spoke pt and [facility] Was pre[facility] arranged with [facility]. Pt agreeable to [facility]. Requesting RW and cane for home.   PCP Jarome MatinPATERSON, DANIEL MD  Expected Discharge Date:  05/10/2016           Expected Discharge Plan:  Home w Home Health Services  In-House Referral:  NA  Discharge planning Services  CM Consult  Post Acute Care Choice:  Home Health Choice offered to:  Patient  DME Arranged:  Dan HumphreysWalker rolling, Cane DME Agency:  [facility]  HH Arranged:  PT HH Agency[facility]  [facility]  Status of Service:  Completed, signed off  If discussed at [employer] Length of Stay Meetings, dates discussed:    Additional Comments:  Elliot CousinShavis, Rodderick Holtzer Ellen, RN 05/10/2016, 9:30 AM

## 2016-05-10 NOTE — Progress Notes (Signed)
     Subjective: 2 Days Post-Op Procedure(s) (LRB): LEFT TOTAL KNEE ARTHROPLASTY (Left)   Patient reports pain as mild, pain controlled. No events throughout the night.  ACE bandage removed.  Feels that she is progressing well. Ready to be discharged home.   Objective:   VITALS:   Vitals:   05/09/16 2100 05/10/16 0503  BP: (!) 148/57 125/60  Pulse: 82 73  Resp: 16 16  Temp: 98.7 F (37.1 C) 98.4 F (36.9 C)    Dorsiflexion/Plantar flexion intact Incision: dressing C/D/I No cellulitis present Compartment soft  LABS  Recent Labs  05/08/16 1523 05/09/16 0423 05/10/16 0402  HGB 14.1 12.4 11.6*  HCT 40.5 35.3* 32.8*  WBC 10.6* 19.3* 20.8*  PLT 242 234 232     Recent Labs  05/08/16 1523 05/09/16 0423 05/10/16 0402  NA  --  136 136  K  --  4.1 4.0  BUN  --  13 15  CREATININE 0.66 0.56 0.57  GLUCOSE  --  134* 121*     Assessment/Plan: 2 Days Post-Op Procedure(s) (LRB): LEFT TOTAL KNEE ARTHROPLASTY (Left) Up with therapy Discharge home with home health  [facility] Follow up with Dr. Thomasena Edisollins in 2 weeks.  Contact information:  [facility] [address], [address] [place] [id number] [phone]        Anastasio AuerbachMatthew S. Darold Miley   PAC  05/10/2016, 8:40 AM

## 2016-05-11 DIAGNOSIS — I340 Nonrheumatic mitral (valve) insufficiency: Secondary | ICD-10-CM | POA: Diagnosis not present

## 2016-05-11 DIAGNOSIS — Z471 Aftercare following joint replacement surgery: Secondary | ICD-10-CM | POA: Diagnosis not present

## 2016-05-11 DIAGNOSIS — I10 Essential (primary) hypertension: Secondary | ICD-10-CM | POA: Diagnosis not present

## 2016-05-11 DIAGNOSIS — Z96652 Presence of left artificial knee joint: Secondary | ICD-10-CM | POA: Diagnosis not present

## 2016-05-12 NOTE — Discharge Summary (Signed)
Physician Discharge Summary  Patient ID: Candice Richardson MRN: [id number] DOB/AGE: [birth year] 70 y.o.  Admit date: 05/08/2016 Discharge date: 05/10/2016   Procedures:  Procedure(s) (LRB): LEFT TOTAL KNEE ARTHROPLASTY (Left)  Attending Physician:  Dr. Durene RomansMatthew Olin   Admission Diagnoses:   Left knee pain  Discharge Diagnoses:  Active Problems:   Total knee replacement status, left  Past Medical History:  Diagnosis Date  . Arthritis   . GERD (gastroesophageal reflux disease)   . Heart murmur   . Hyperlipidemia   . Hypertension   . Torn meniscus     HPI:    Candice Richardson, 70 y.o. female, has a history of pain and functional disability in the left knee due to arthritis and has failed non-surgical conservative treatments for greater than 12 weeks to includeNSAID's and/or analgesics, corticosteriod injections, viscosupplementation injections, use of assistive devices, weight reduction as appropriate and activity modification.  Onset of symptoms was gradual, starting 2 years ago with gradually worsening course since that time. The patient noted prior procedures on the knee to include  arthroscopy on the left knee(s).  Patient currently rates pain in the left knee(s) at 6 out of 10 with activity. Patient has night pain, worsening of pain with activity and weight bearing, pain that interferes with activities of daily living and pain with passive range of motion.  Patient has evidence of periarticular osteophytes and joint space narrowing by imaging studies. There is no active infection.  PCP: Garlan FillersPATERSON,DANIEL G, MD   Discharged Condition: good  Hospital Course:  Patient underwent the above stated procedure on 05/08/2016. Patient tolerated the procedure well and brought to the recovery room in good condition and subsequently to the floor.  POD #1 BP: 120/61 ; Pulse: 65 ; Temp: 98 F (36.7 C) ; Resp: 16 Pt doing well.  Pain is mild currently.  Ready for PT/OT today.  Denies any new  symptoms or issues.  Left knee dressing intact, drain pulled, nv intact distally and no rashes or edema.  LABS  Basename    HGB     12.4  HCT     35.3   POD #2  BP: 125/60 ; Pulse: 73 ; Temp: 98.4 F (36.9 C) ; Resp: 16 Patient reports pain as mild, pain controlled. No events throughout the night.  ACE bandage removed.  Feels that she is progressing well. Ready to be discharged home.  Dorsiflexion/plantar flexion intact, incision: dressing C/D/I, no cellulitis present and compartment soft.   LABS  Basename    HGB     11.6  HCT     32.8    Discharge Exam: General appearance: alert, cooperative and no distress Extremities: Homans sign is negative, no sign of DVT, no edema, redness or tenderness in the calves or thighs and no ulcers, gangrene or trophic changes  Disposition: Home with follow up in 2 weeks   Follow-up Information    Erasmo LeventhalOLLINS,ROBERT ANDREW, MD. Schedule an appointment as soon as possible for a visit in 2 week(s).   Specialty:  Orthopedic Surgery Contact information: [address] [place] [address] [phone]        [facility] Follow up.   Specialty:  Home Health Services Why:  Home Health Physical Therapy Contact information: [address] [place] [address] [phone]           Discharge Instructions    Call MD / Call 911    Complete by:  As directed  If you experience chest pain or shortness of breath, CALL 911 and be transported to the hospital emergency room.  If you develope a fever above 101 F, pus (white drainage) or increased drainage or redness at the wound, or calf pain, call your surgeon's office.   Change dressing    Complete by:  As directed    Maintain surgical dressing until follow up in the clinic. If the edges start to pull up, may reinforce with tape. If the dressing is no longer working, may remove and cover with gauze and tape, but must keep the area dry and clean.  Call with any  questions or concerns.   Constipation Prevention    Complete by:  As directed    Drink plenty of fluids.  Prune juice may be helpful.  You may use a stool softener, such as Colace (over the counter) 100 mg twice a day.  Use MiraLax (over the counter) for constipation as needed.   Diet - low sodium heart healthy    Complete by:  As directed    Discharge instructions    Complete by:  As directed    Maintain surgical dressing until follow up in the clinic. If the edges start to pull up, may reinforce with tape. If the dressing is no longer working, may remove and cover with gauze and tape, but must keep the area dry and clean.  [facility]. Call with any questions or concerns.   Increase activity slowly as tolerated    Complete by:  As directed    Weight bearing as tolerated with assist device (walker, cane, etc) as directed, use it as long as suggested by your surgeon or therapist, typically at least 4-6 weeks.   TED hose    Complete by:  As directed    Use stockings (TED hose) for 2 weeks on both leg(s).  You may remove them at night for sleeping.      Allergies as of 05/10/2016   No Known Allergies     Medication List    TAKE these medications   acetaminophen 325 MG tablet Commonly known as:  TYLENOL Take 2 tablets (650 mg total) by mouth every 6 (six) hours as needed for mild pain (or Fever >/= 101).   amLODipine 5 MG tablet Commonly known as:  NORVASC Take 5 mg by mouth every evening.   aspirin EC 325 MG tablet Take 1 tablet (325 mg total) by mouth 2 (two) times daily. Take for 4 weeks.   CALTRATE 600+D PO Take 1 tablet by mouth 2 (two) times daily.   docusate sodium 100 MG capsule Commonly known as:  COLACE Take 1 capsule (100 mg total) by mouth 2 (two) times daily.   ferrous sulfate 325 (65 FE) MG tablet Take 1 tablet (325 mg total) by mouth 3 (three) times daily after meals.   methocarbamol 500 MG tablet Commonly known as:   ROBAXIN Take 1 tablet (500 mg total) by mouth every 6 (six) hours as needed for muscle spasms.   metoprolol succinate 50 MG 24 hr tablet Commonly known as:  TOPROL-XL Take 50 mg by mouth daily. Take with or immediately following a meal.   multivitamin with minerals tablet Take 1 tablet by mouth daily.   omeprazole 20 MG capsule Commonly known as:  PRILOSEC Take 20 mg by mouth every evening.   ondansetron 4 MG tablet Commonly known as:  ZOFRAN Take 1 tablet (4 mg total) by mouth  every 6 (six) hours as needed for nausea.   oxyCODONE 5 MG immediate release tablet Commonly known as:  Oxy IR/ROXICODONE Take 1-2 tablets (5-10 mg total) by mouth every 4 (four) hours as needed for moderate pain.   polyethylene glycol packet Commonly known as:  MIRALAX / GLYCOLAX Take 17 g by mouth 2 (two) times daily.   PREMARIN vaginal cream Generic drug:  conjugated estrogens Place 1 application vaginally every 14 (fourteen) days.   rosuvastatin 20 MG tablet Commonly known as:  CRESTOR Take 20 mg by mouth daily.   vitamin B-12 500 MCG tablet Commonly known as:  CYANOCOBALAMIN Take 500 mcg by mouth daily.        Signed: Anastasio Auerbach. Vernie Vinciguerra   PA-C  05/12/2016, 3:28 PM

## 2016-05-22 DIAGNOSIS — Z96652 Presence of left artificial knee joint: Secondary | ICD-10-CM | POA: Diagnosis not present

## 2016-05-29 DIAGNOSIS — M1712 Unilateral primary osteoarthritis, left knee: Secondary | ICD-10-CM | POA: Diagnosis not present

## 2016-06-02 DIAGNOSIS — M1712 Unilateral primary osteoarthritis, left knee: Secondary | ICD-10-CM | POA: Diagnosis not present

## 2016-06-05 DIAGNOSIS — M1712 Unilateral primary osteoarthritis, left knee: Secondary | ICD-10-CM | POA: Diagnosis not present

## 2016-06-09 DIAGNOSIS — M1712 Unilateral primary osteoarthritis, left knee: Secondary | ICD-10-CM | POA: Diagnosis not present

## 2016-06-12 DIAGNOSIS — M1712 Unilateral primary osteoarthritis, left knee: Secondary | ICD-10-CM | POA: Diagnosis not present

## 2016-06-16 DIAGNOSIS — M1712 Unilateral primary osteoarthritis, left knee: Secondary | ICD-10-CM | POA: Diagnosis not present

## 2016-06-19 DIAGNOSIS — M1712 Unilateral primary osteoarthritis, left knee: Secondary | ICD-10-CM | POA: Diagnosis not present

## 2016-06-22 DIAGNOSIS — M1712 Unilateral primary osteoarthritis, left knee: Secondary | ICD-10-CM | POA: Diagnosis not present

## 2016-06-25 DIAGNOSIS — M1712 Unilateral primary osteoarthritis, left knee: Secondary | ICD-10-CM | POA: Diagnosis not present

## 2016-06-29 DIAGNOSIS — M1712 Unilateral primary osteoarthritis, left knee: Secondary | ICD-10-CM | POA: Diagnosis not present

## 2016-07-02 DIAGNOSIS — M1712 Unilateral primary osteoarthritis, left knee: Secondary | ICD-10-CM | POA: Diagnosis not present

## 2016-07-06 DIAGNOSIS — M1712 Unilateral primary osteoarthritis, left knee: Secondary | ICD-10-CM | POA: Diagnosis not present

## 2016-07-09 DIAGNOSIS — M1712 Unilateral primary osteoarthritis, left knee: Secondary | ICD-10-CM | POA: Diagnosis not present

## 2016-07-13 DIAGNOSIS — M1712 Unilateral primary osteoarthritis, left knee: Secondary | ICD-10-CM | POA: Diagnosis not present

## 2016-07-16 DIAGNOSIS — M1712 Unilateral primary osteoarthritis, left knee: Secondary | ICD-10-CM | POA: Diagnosis not present

## 2016-07-20 DIAGNOSIS — M25562 Pain in left knee: Secondary | ICD-10-CM | POA: Diagnosis not present

## 2016-07-20 DIAGNOSIS — E784 Other hyperlipidemia: Secondary | ICD-10-CM | POA: Diagnosis not present

## 2016-07-20 DIAGNOSIS — I10 Essential (primary) hypertension: Secondary | ICD-10-CM | POA: Diagnosis not present

## 2016-07-20 DIAGNOSIS — G4709 Other insomnia: Secondary | ICD-10-CM | POA: Diagnosis not present

## 2016-07-22 DIAGNOSIS — M1712 Unilateral primary osteoarthritis, left knee: Secondary | ICD-10-CM | POA: Diagnosis not present

## 2016-07-30 DIAGNOSIS — M1712 Unilateral primary osteoarthritis, left knee: Secondary | ICD-10-CM | POA: Diagnosis not present

## 2016-08-03 DIAGNOSIS — Z96652 Presence of left artificial knee joint: Secondary | ICD-10-CM | POA: Diagnosis not present

## 2016-08-03 DIAGNOSIS — Z471 Aftercare following joint replacement surgery: Secondary | ICD-10-CM | POA: Diagnosis not present

## 2016-08-10 DIAGNOSIS — M1712 Unilateral primary osteoarthritis, left knee: Secondary | ICD-10-CM | POA: Diagnosis not present

## 2016-08-19 ENCOUNTER — Encounter: Payer: Self-pay | Admitting: Internal Medicine

## 2016-08-24 DIAGNOSIS — M1712 Unilateral primary osteoarthritis, left knee: Secondary | ICD-10-CM | POA: Diagnosis not present

## 2016-09-07 DIAGNOSIS — M1712 Unilateral primary osteoarthritis, left knee: Secondary | ICD-10-CM | POA: Diagnosis not present

## 2016-09-21 DIAGNOSIS — M1712 Unilateral primary osteoarthritis, left knee: Secondary | ICD-10-CM | POA: Diagnosis not present

## 2016-09-28 DIAGNOSIS — Z471 Aftercare following joint replacement surgery: Secondary | ICD-10-CM | POA: Diagnosis not present

## 2016-09-28 DIAGNOSIS — Z96652 Presence of left artificial knee joint: Secondary | ICD-10-CM | POA: Diagnosis not present

## 2016-09-30 ENCOUNTER — Encounter: Payer: Self-pay | Admitting: Internal Medicine

## 2016-09-30 ENCOUNTER — Telehealth: Payer: Self-pay | Admitting: Cardiovascular Disease

## 2016-09-30 NOTE — Telephone Encounter (Signed)
Pt of Dr. Michail SermonKelly  Spoke to patient. Not due for yearly visit until Sept, wondering if she needs to be seen prior.  She identifies problem w fatigue, seems to be in last 2 weeks. However, she identifies 4 months of "insomnia", interrupted/poor sleep quality prior to her knee surgery.  She was prescribed sleeping pills at that time which  She is about 2-3 weeks post- surgery and reports she's doing quite well, sleeping better at night, but reports she feels sluggish and fatigued usually by midafternoon.  Pt identifies NO chest pain, palpitations, shortness of breath. Reports "occasionally I get lightheaded" but other than fatigue, no other concerns.   Also of note, patient shares that she was prescribed iron supplements d/t lab findings - has not started these.  She has f/u w PCP in June and pending labwork for this visit. I recommended she take the iron supplements and gave rationale as to why this may help with her energy level. Pt aware I'll route to Dr. Tresa EndoKelly for further recommendations.

## 2016-09-30 NOTE — Telephone Encounter (Signed)
Patient calling, recently had knee surgery. Patient complains that for the past two weeks she has just been tired, "like dragging by 2pm in the afternoon." Patient mentioned that she is worried that it could be a blockage and would like to discuss her symptoms. Thanks.

## 2016-10-02 NOTE — Telephone Encounter (Signed)
Spoke to patient again, she will discuss ongoing concerns w Dr. Tresa EndoKelly in office - appt made for Mon 5/14 at 3:40pm

## 2016-10-05 ENCOUNTER — Ambulatory Visit (INDEPENDENT_AMBULATORY_CARE_PROVIDER_SITE_OTHER): Payer: Medicare Other | Admitting: Cardiovascular Disease

## 2016-10-05 VITALS — BP 122/68 | HR 73 | Ht 67.0 in | Wt 150.2 lb

## 2016-10-05 DIAGNOSIS — E7800 Pure hypercholesterolemia, unspecified: Secondary | ICD-10-CM | POA: Diagnosis not present

## 2016-10-05 DIAGNOSIS — R5383 Other fatigue: Secondary | ICD-10-CM | POA: Diagnosis not present

## 2016-10-05 DIAGNOSIS — I340 Nonrheumatic mitral (valve) insufficiency: Secondary | ICD-10-CM | POA: Diagnosis not present

## 2016-10-05 DIAGNOSIS — K219 Gastro-esophageal reflux disease without esophagitis: Secondary | ICD-10-CM

## 2016-10-05 DIAGNOSIS — I10 Essential (primary) hypertension: Secondary | ICD-10-CM | POA: Diagnosis not present

## 2016-10-05 NOTE — Patient Instructions (Signed)
Medication Instructions: Please try taking your Metoprolol at bedtime and your Amlodipine during the day. Let us know if this helps with the fatigue.   Follow-Up: Your physician wants you to follow-up in: ONE YEAR WITH DR. Tresa EndoKELLY. You will receive a reminder letter in the mail two months in advance. If you don't receive a letter, please call our office to schedule the follow-up appointment.   If you need a refill on your cardiac medications before your next appointment, please call your pharmacy.

## 2016-10-05 NOTE — Progress Notes (Signed)
Patient ID: Candice Richardson, female   DOB: Mar 31, 1946, 71 y.o.   MRN: [id number]    PCP: Dr. Dossie Arbour  HPI:  [name] is a 71 year old female who presents for an 8 month follow-up cardiology evaluation.  [name] has a history of hypertension, GERD, as well as hyperlipidemia.  She was told of having a heart murmur over 15 years ago when living in [place] and underwent frequent echo Doppler studies, as well as stress tests.  She does exercise regularly.  I first saw her in 2015 when she was referred to me by Dr. Jarold Motto after she has noticed some left sided chest pain, which he feels is more positional and muscular.   She described the discomfort at times as a "throbbing ache."  Sometimes, sitting makes it worse.  She denied a clear-cut exertional precipitation.  I saw her prior to her planned travel and was on amlodipine 5 mg and Lopressor 50 mg daily for blood pressure control.  She has a history of GERD which has benefited from omeprazole.  She had not had any subsequent echo Doppler studies or stress test since she moved to the [place] from [place] several years ago.  When I saw her, I recommend over-the-counter Aleve 4 mg twice a day for 3-4 days and then one tablet twice a day.  This resolved her probable musculoskeletal chest pain.  She underwent  a routine treadmill test and reportedly had approximately 2 mm of horizontal ST depression that was completely asymptomatic at peak stress achieving a workload of 11 mets maximal heart rate of 136 beats per minute, representing 90% of predicted maximum.  Subsequently, she a nuclear myocardial perfusion was entirely normal and showed normal LV function, normal wall motion and a post-rest ejection fraction of 69%.  Perfusion was normal to all regions and no significant ECG changes.developed. An echo Doppler study revealed an ejection fraction of 55-60% without wall motion abnormalities.  She had normal diastolic  parameters. Her mitral valve was mildly thickened with mild regurgitation and there was mild TR.  There was mild pulmonary hypertension with estimated PA pressure 37.  Since I last saw her, she underwent left knee replacement in December 2017 by Dr. Valma Cava.  She had difficulty sleeping for approximate 4 months after her knee surgery.  Lately this has improved.  Presently, she typically goes to bed at 11:30 and wakes up at 7:30 in the morning.  She denies any episodes of chest tightness.  She continues to be on amlodipine 5 mg, Toprol-XL 50 mg for hypertension.  She continues to be on Crestor 20 mg for hyperlipidemia.  She has GERD on omeprazole.  She presents for reevaluation.  Past Medical History:  Diagnosis Date  . Arthritis   . GERD (gastroesophageal reflux disease)   . Heart murmur   . Hyperlipidemia   . Hypertension   . Torn meniscus     Past Surgical History:  Procedure Laterality Date  . APPENDECTOMY  1947   66 months old  . TOTAL KNEE ARTHROPLASTY Left 05/08/2016   Procedure: LEFT TOTAL KNEE ARTHROPLASTY;  Surgeon: Eugenia Mcalpine, MD;  Location: [facility];  Service: Orthopedics;  Laterality: Left;    No Known Allergies  Current Outpatient Prescriptions  Medication Sig Dispense Refill  . acetaminophen (TYLENOL) 325 MG tablet Take 2 tablets (650 mg total) by mouth every 6 (six) hours as needed for mild pain (or Fever >/= 101).    Marland Kitchen amLODipine (NORVASC) 5 MG  tablet Take 5 mg by mouth every evening.     . Calcium Carbonate-Vitamin D (CALTRATE 600+D PO) Take 1 tablet by mouth 2 (two) times daily.    Marland Kitchen. docusate sodium (COLACE) 100 MG capsule Take 1 capsule (100 mg total) by mouth 2 (two) times daily. 10 capsule 0  . metoprolol succinate (TOPROL-XL) 50 MG 24 hr tablet Take 50 mg by mouth daily. Take with or immediately following a meal.    . Multiple Vitamins-Minerals (MULTIVITAMIN WITH MINERALS) tablet Take 1 tablet by mouth daily.    Marland Kitchen. omeprazole (PRILOSEC) 20 MG capsule Take  20 mg by mouth every evening.     . ondansetron (ZOFRAN) 4 MG tablet Take 1 tablet (4 mg total) by mouth every 6 (six) hours as needed for nausea. 30 tablet 0  . polyethylene glycol (MIRALAX / GLYCOLAX) packet Take 17 g by mouth 2 (two) times daily. 14 each 0  . PREMARIN vaginal cream Place 1 application vaginally every 14 (fourteen) days.     . rosuvastatin (CRESTOR) 20 MG tablet Take 20 mg by mouth daily.  2  . vitamin B-12 (CYANOCOBALAMIN) 500 MCG tablet Take 1,000 mcg by mouth daily.      No current facility-administered medications for this visit.     Socially, [place]  She lived in [place] for approximately 10-15 years.  She is widowed.  She does travel.  She has 2 children and 4 grandchildren.  She does drink occasional wine.  There is no tobacco use  Family History  Problem Relation Age of Onset  . Heart disease Mother   . Stomach cancer Father   . Colon polyps Brother   . Colon cancer Cousin     ROS General: Negative; No fevers, chills, or night sweats.  Positive for fatigue HEENT: Negative; No changes in vision or hearing, sinus congestion, difficulty swallowing Pulmonary: Negative; No cough, wheezing, shortness of breath, hemoptysis Cardiovascular: Positive for left-sided chest pain, no presyncope, syncope, palpatations GI: Positive for GERD No nausea, vomiting, diarrhea, or abdominal pain GU: Negative; No dysuria, hematuria, or difficulty voiding Musculoskeletal: Status post left knee replacement December 2017 Hematologic/Oncologic: Negative; no easy bruising, bleeding Endocrine: Negative; no heat/cold intolerance; no diabetes Neuro: Negative; no changes in balance, headaches Skin: Negative; No rashes or skin lesions Psychiatric: Negative; No behavioral problems, depression Sleep: Positive for fatigue.; occasional daytime sleepiness, no bruxism, restless legs, hypnogagnic hallucinations Other comprehensive 14 point system review is  negative  Physical Exam BP 122/68 (BP Location: Right Arm, Patient Position: Sitting, Cuff Size: Normal)   Pulse 73   Ht 5\' 7"  (1.702 m)   Wt 150 lb 3.2 oz (68.1 kg)   BMI 23.52 kg/m    Repeat blood pressure 122/70  Wt Readings from Last 3 Encounters:  10/05/16 150 lb 3.2 oz (68.1 kg)  05/08/16 146 lb (66.2 kg)  05/01/16 146 lb (66.2 kg)   General: Alert, oriented, no distress.  Skin: normal turgor, no rashes, warm and dry HEENT: Normocephalic, atraumatic. Pupils equal round and reactive to light; sclera anicteric; extraocular muscles intact; Fundi no AV nicking, hemorrhages, or exudates. Nose without nasal septal hypertrophy Mouth/Parynx benign; Mallinpatti scale 2 Neck: No JVD, no carotid bruits; normal carotid upstroke Lungs: clear to ausculatation and percussion; no wheezing or rales Chest wall: Resolution of prior tenderness to palpitation over the left costochondral region Heart: PMI not displaced, RRR, s1 s2 normal, 1/6 systolic murmur at the apex and lower sternal border, no diastolic murmur,  no rubs, gallops, thrills, or heaves Abdomen: soft, nontender; no hepatosplenomehaly, BS+; abdominal aorta nontender and not dilated by palpation. Back: no CVA tenderness Pulses 2+ Musculoskeletal: full range of motion, normal strength, no joint deformities Extremities: no clubbing cyanosis or edema, Homan's sign negative  Neurologic: grossly nonfocal; Cranial nerves grossly wnl Psychologic: Normal mood and affect  ECG (independently read by me): Normal sinus rhythm at 73 bpm.  Normal intervals.  No ST segment changes.  September 2017 ECG (independently read by me): Normal sinus rhythm at 71 bpm.  No ectopy.  Normal intervals.  August 2016 ECG (independently read by me): Normal sinus rhythm at 60 bpm.  No ectopy.  Normal intervals.  May 2015  ECG (independently read by me): Normal sinus rhythm at 73 beats per minute.  No ectopy.  Normal intervals.  LABS: Recently done by Dr.  Jarome Matin.  BMP Latest Ref Rng & Units 05/10/2016 05/09/2016 05/08/2016  Glucose 65 - 99 mg/dL 161(W) 960(A) -  BUN 6 - 20 mg/dL 15 13 -  Creatinine 5.40 - 1.00 mg/dL 9.81 1.91 4.78  Sodium 135 - 145 mmol/L 136 136 -  Potassium 3.5 - 5.1 mmol/L 4.0 4.1 -  Chloride 101 - 111 mmol/L 104 104 -  CO2 22 - 32 mmol/L 27 23 -  Calcium 8.9 - 10.3 mg/dL 2.9(F) 6.2(Z) -   No flowsheet data found. No results found for: TSH  Lipid Panel  No results found for: CHOL, TRIG, HDL, CHOLHDL, VLDL, LDLCALC, LDLDIRECT  RADIOLOGY: No results found.  IMPRESSION:  1. Essential hypertension   2. Pure hypercholesterolemia   3. Mild mitral regurgitation   4. Gastroesophageal reflux disease without esophagitis   5. Fatigue, unspecified type     ASSESSMENT AND PLAN: Ms. Tukes is a 71 year-old female who has a history of hypertension, hyperlipidemia, with cholesterols as high as 240 in the past, as well as GERD. Remotely, she had taken simvastatin for hyperlipidemia but also this was changed to Crestor 20 mg.  At last laboratory with Dr. Jarold Motto.  She was told that her lipids were excellent.  She is planned to have repeat blood work with him in several weeks.  Her blood pressure today is controlled on her current regimen.  However, she continues to experience daytime fatigue.  I have suggested she switch and take Toprol-XL 50 mg at bedtime and take the amlodipine instead of at bedtime in the morning.  This may improve some of her symptomatology.  She had sleep issues following her knee surgery due to her knee discomfort.  She is not having any anginal type symptoms.  Her GERD is controlled on omeprazole.  Her weight is stable with a BMI of 23.5.  Her exam is stable with previously noted mild mitral regurgitation and mild TR.  I set her blood work be sent to me for my review.  As  long as she remains stable, I will see her in one year for reevaluation.  Time spent: 25 minutes Lennette Bihari, MD,  [facility] 10/07/2016 6:26 PM

## 2016-10-07 ENCOUNTER — Encounter: Payer: Self-pay | Admitting: Cardiovascular Disease

## 2016-11-16 DIAGNOSIS — I10 Essential (primary) hypertension: Secondary | ICD-10-CM | POA: Diagnosis not present

## 2016-11-16 DIAGNOSIS — E784 Other hyperlipidemia: Secondary | ICD-10-CM | POA: Diagnosis not present

## 2016-11-19 DIAGNOSIS — Z0000 Encounter for general adult medical examination without abnormal findings: Secondary | ICD-10-CM | POA: Diagnosis not present

## 2016-11-19 DIAGNOSIS — I10 Essential (primary) hypertension: Secondary | ICD-10-CM | POA: Diagnosis not present

## 2016-11-19 DIAGNOSIS — K219 Gastro-esophageal reflux disease without esophagitis: Secondary | ICD-10-CM | POA: Diagnosis not present

## 2016-11-19 DIAGNOSIS — Z1212 Encounter for screening for malignant neoplasm of rectum: Secondary | ICD-10-CM | POA: Diagnosis not present

## 2016-11-19 DIAGNOSIS — R1084 Generalized abdominal pain: Secondary | ICD-10-CM | POA: Diagnosis not present

## 2016-11-23 DIAGNOSIS — M25662 Stiffness of left knee, not elsewhere classified: Secondary | ICD-10-CM | POA: Diagnosis not present

## 2016-11-23 DIAGNOSIS — Z96652 Presence of left artificial knee joint: Secondary | ICD-10-CM | POA: Diagnosis not present

## 2016-11-23 DIAGNOSIS — Z471 Aftercare following joint replacement surgery: Secondary | ICD-10-CM | POA: Diagnosis not present

## 2016-12-02 ENCOUNTER — Ambulatory Visit (AMBULATORY_SURGERY_CENTER): Payer: Medicare Other

## 2016-12-02 VITALS — Ht 67.0 in | Wt 152.8 lb

## 2016-12-02 DIAGNOSIS — Z86010 Personal history of colonic polyps: Secondary | ICD-10-CM

## 2016-12-02 MED ORDER — NA SULFATE-K SULFATE-MG SULF 17.5-3.13-1.6 GM/177ML PO SOLN: 1.0000 | Freq: Once | ORAL | 0 refills | Status: AC

## 2016-12-02 NOTE — Progress Notes (Signed)
Denies allergies to eggs or soy products. Denies complication of anesthesia or sedation. Denies use of weight loss medication. Denies use of O2.   Emmi instructions given for colonoscopy.  

## 2016-12-07 ENCOUNTER — Encounter: Payer: Self-pay | Admitting: Internal Medicine

## 2016-12-15 ENCOUNTER — Other Ambulatory Visit: Payer: Self-pay | Admitting: Obstetrics and Gynecology

## 2016-12-15 DIAGNOSIS — Z1231 Encounter for screening mammogram for malignant neoplasm of breast: Secondary | ICD-10-CM

## 2016-12-16 ENCOUNTER — Encounter: Payer: Self-pay | Admitting: Internal Medicine

## 2016-12-16 ENCOUNTER — Ambulatory Visit (AMBULATORY_SURGERY_CENTER): Payer: Medicare Other | Admitting: Internal Medicine

## 2016-12-16 VITALS — BP 109/57 | HR 64 | Temp 97.5°F | Resp 12 | Ht 67.0 in | Wt 150.0 lb

## 2016-12-16 DIAGNOSIS — Z800 Family history of malignant neoplasm of digestive organs: Secondary | ICD-10-CM

## 2016-12-16 DIAGNOSIS — Z86010 Personal history of colonic polyps: Secondary | ICD-10-CM

## 2016-12-16 DIAGNOSIS — I10 Essential (primary) hypertension: Secondary | ICD-10-CM | POA: Diagnosis not present

## 2016-12-16 MED ORDER — SODIUM CHLORIDE 0.9 % IV SOLN: 500.0000 mL | INTRAVENOUS | Status: AC

## 2016-12-16 NOTE — Patient Instructions (Signed)

## 2016-12-16 NOTE — Progress Notes (Signed)
To recovery, report to Thomas, RN, VSS 

## 2016-12-16 NOTE — Op Note (Signed)
[facility] Patient Name: Candice Richardson Procedure Date: 12/16/2016 10:28 AM MRN: [id number] Endoscopist: Wilhemina Bonito. Marina Goodell , MD Age: 71 Referring MD:  Date of Birth: Apr 18, 1946 Gender: Female Account #: [id number] Procedure:                Colonoscopy Indications:              High risk colon cancer surveillance: Personal                            history of colonic polyps (remotely elsewhere).                            Colonoscopies [address] [place] in 2013                            here. No polyps. Family history of malignant colon                            polyp in her brother and colon cancer in a                            second-degree relative Medicines:                Monitored Anesthesia Care Procedure:                Pre-Anesthesia Assessment:                           - Prior to the procedure, a History and Physical                            was performed, and patient medications and                            allergies were reviewed. The patient's tolerance of                            previous anesthesia was also reviewed. The risks                            and benefits of the procedure and the sedation                            options and risks were discussed with the patient.                            All questions were answered, and informed consent                            was obtained. Prior Anticoagulants: The patient has                            taken no previous anticoagulant or antiplatelet  agents. ASA Grade Assessment: II - A patient with                            mild systemic disease. After reviewing the risks                            and benefits, the patient was deemed in                            satisfactory condition to undergo the procedure.                           After obtaining informed consent, the colonoscope                            was passed under direct vision. Throughout the                             procedure, the patient's blood pressure, pulse, and                            oxygen saturations were monitored continuously. The                            Colonoscope was introduced through the anus and                            advanced to the the cecum, identified by                            appendiceal orifice and ileocecal valve. The                            ileocecal valve, appendiceal orifice, and rectum                            were photographed. The quality of the bowel                            preparation was excellent. The colonoscopy was                            performed without difficulty. The patient tolerated                            the procedure well. The bowel preparation used was                            SUPREP. Scope In: 10:33:33 AM Scope Out: 10:44:06 AM Scope Withdrawal Time: 0 hours 7 minutes 27 seconds  Total Procedure Duration: 0 hours 10 minutes 33 seconds  Findings:                 Multiple small and large-mouthed diverticula were  found in the left colon and right colon. Mild                            sigmoid stenosis.                           Internal hemorrhoids were found during retroflexion.                           The exam was otherwise without abnormality on                            direct and retroflexion views. Complications:            No immediate complications. Estimated blood loss:                            None. Estimated Blood Loss:     Estimated blood loss: none. Impression:               - Diverticulosis in the left colon and in the right                            colon. Mild sigmoid stenosis.                           - Internal hemorrhoids. Small.                           - The examination was otherwise normal on direct                            and retroflexion views.                           - No specimens collected. Recommendation:           - Repeat  colonoscopy in 5 years for                            surveillance(PEDIATRIC COLONOSCOPE).                           - Patient has a contact number available for                            emergencies. The signs and symptoms of potential                            delayed complications were discussed with the                            patient. Return to normal activities tomorrow.                            Written discharge instructions were provided to the  patient.                           - Resume previous diet.                           - Continue present medications. Wilhemina BonitoJohn N. Marina GoodellPerry, MD 12/16/2016 10:48:55 AM This report has been signed electronically.

## 2016-12-16 NOTE — Progress Notes (Signed)
Pt's states no medical or surgical changes since previsit or office visit. 

## 2016-12-17 ENCOUNTER — Telehealth: Payer: Self-pay

## 2016-12-17 NOTE — Telephone Encounter (Signed)
  Follow up Call-  Call back number 12/16/2016  Post procedure Call Back phone  # [phone]  Permission to leave phone message Yes  Some recent data might be hidden     Patient questions:  Do you have a fever, pain , or abdominal swelling? No. Pain Score  0 *  Have you tolerated food without any problems? Yes.    Have you been able to return to your normal activities? Yes.    Do you have any questions about your discharge instructions: Diet   No. Medications  No. Follow up visit  No.  Do you have questions or concerns about your Care? No.  Actions: * If pain score is 4 or above: No action needed, pain <4.

## 2017-01-27 DIAGNOSIS — Z01419 Encounter for gynecological examination (general) (routine) without abnormal findings: Secondary | ICD-10-CM | POA: Diagnosis not present

## 2017-01-27 DIAGNOSIS — Z124 Encounter for screening for malignant neoplasm of cervix: Secondary | ICD-10-CM | POA: Diagnosis not present

## 2017-01-27 DIAGNOSIS — Z6824 Body mass index (BMI) 24.0-24.9, adult: Secondary | ICD-10-CM | POA: Diagnosis not present

## 2017-01-27 DIAGNOSIS — N952 Postmenopausal atrophic vaginitis: Secondary | ICD-10-CM | POA: Diagnosis not present

## 2017-01-29 ENCOUNTER — Ambulatory Visit
Admission: RE | Admit: 2017-01-29 | Discharge: 2017-01-29 | Disposition: A | Discharge status: Home / Self Care | Payer: Medicare Other | Source: Ambulatory Visit | Attending: Obstetrics and Gynecology | Admitting: Obstetrics and Gynecology

## 2017-01-29 DIAGNOSIS — Z1231 Encounter for screening mammogram for malignant neoplasm of breast: Secondary | ICD-10-CM | POA: Diagnosis not present

## 2017-05-14 DIAGNOSIS — Z471 Aftercare following joint replacement surgery: Secondary | ICD-10-CM | POA: Diagnosis not present

## 2017-05-14 DIAGNOSIS — Z96652 Presence of left artificial knee joint: Secondary | ICD-10-CM | POA: Diagnosis not present

## 2017-05-14 DIAGNOSIS — M1711 Unilateral primary osteoarthritis, right knee: Secondary | ICD-10-CM | POA: Diagnosis not present

## 2017-05-14 DIAGNOSIS — M25561 Pain in right knee: Secondary | ICD-10-CM | POA: Diagnosis not present

## 2017-06-02 DIAGNOSIS — Z96659 Presence of unspecified artificial knee joint: Secondary | ICD-10-CM | POA: Diagnosis not present

## 2017-06-02 DIAGNOSIS — M7052 Other bursitis of knee, left knee: Secondary | ICD-10-CM | POA: Diagnosis not present

## 2017-06-02 DIAGNOSIS — Z96652 Presence of left artificial knee joint: Secondary | ICD-10-CM | POA: Diagnosis not present

## 2017-07-05 DIAGNOSIS — M179 Osteoarthritis of knee, unspecified: Secondary | ICD-10-CM | POA: Insufficient documentation

## 2017-07-05 DIAGNOSIS — M25562 Pain in left knee: Secondary | ICD-10-CM | POA: Diagnosis not present

## 2017-07-07 DIAGNOSIS — H25013 Cortical age-related cataract, bilateral: Secondary | ICD-10-CM | POA: Diagnosis not present

## 2017-09-30 IMAGING — MG 2D DIGITAL SCREENING BILATERAL MAMMOGRAM WITH CAD AND ADJUNCT TO
9 of 12 series · 9 of 28 positions shown · non-contrast
Comparison: Previous exam(s).

CLINICAL DATA: Screening.

EXAM:
2D DIGITAL SCREENING BILATERAL MAMMOGRAM WITH CAD AND ADJUNCT TOMO

[R CC]
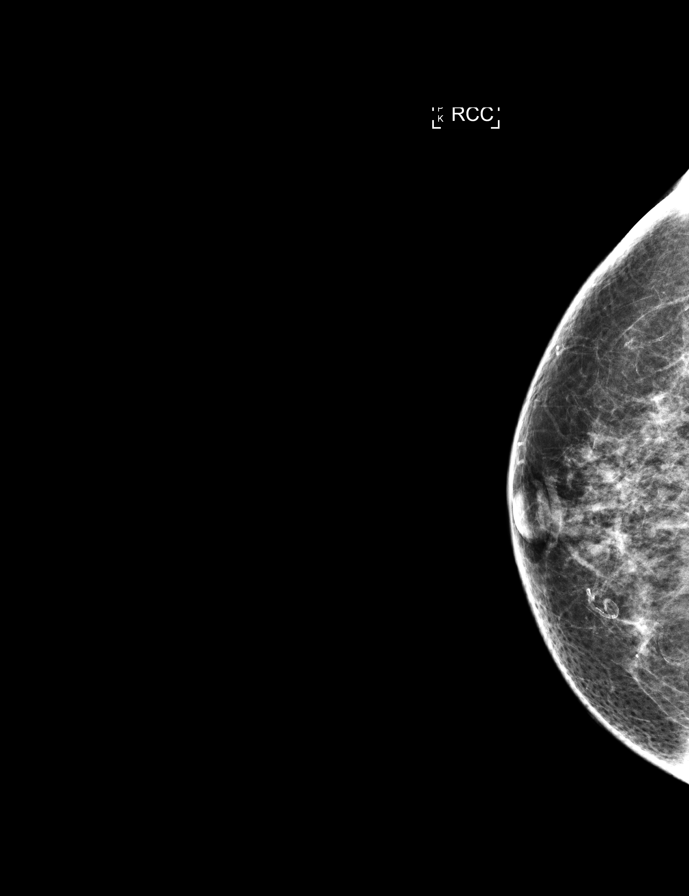

[L CC]
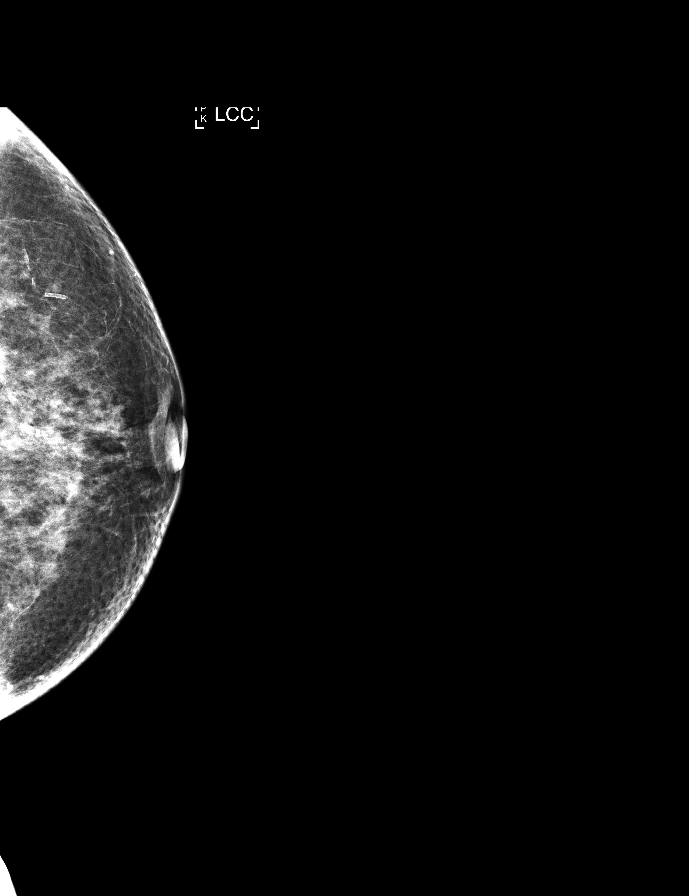

[L MLO synth-2D]
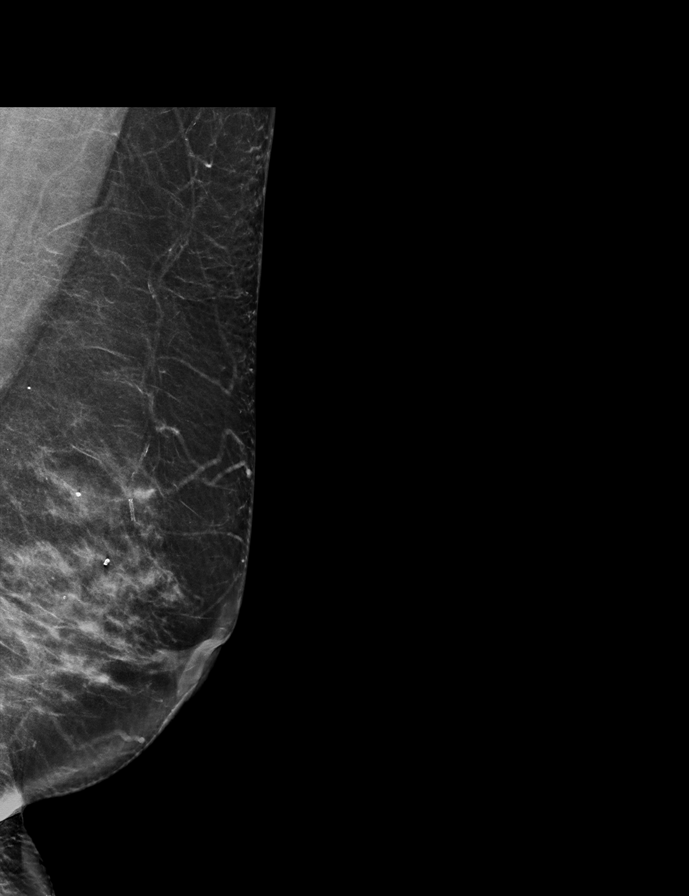

[L CC synth-2D]
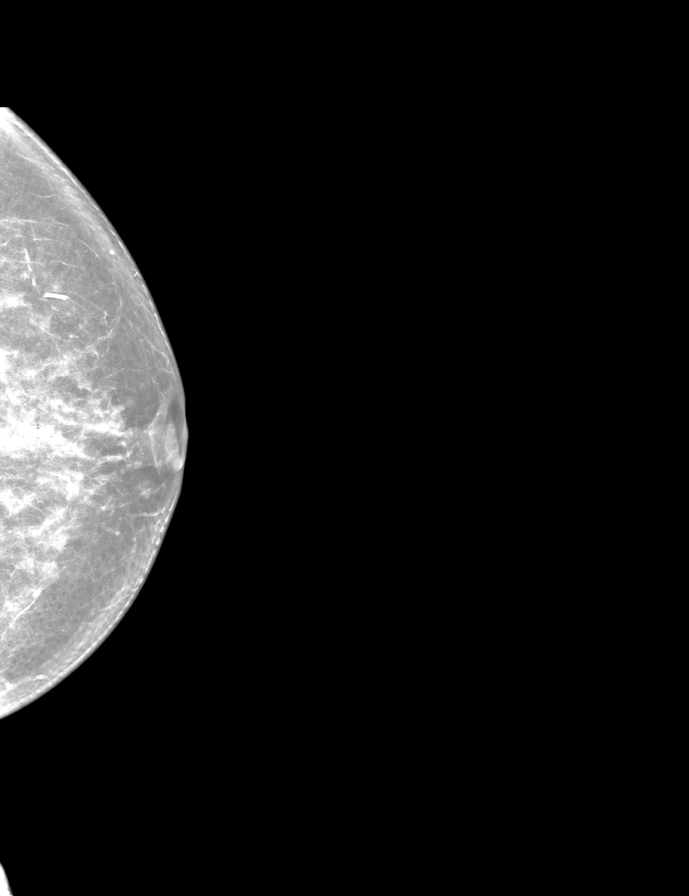

[R CC synth-2D]
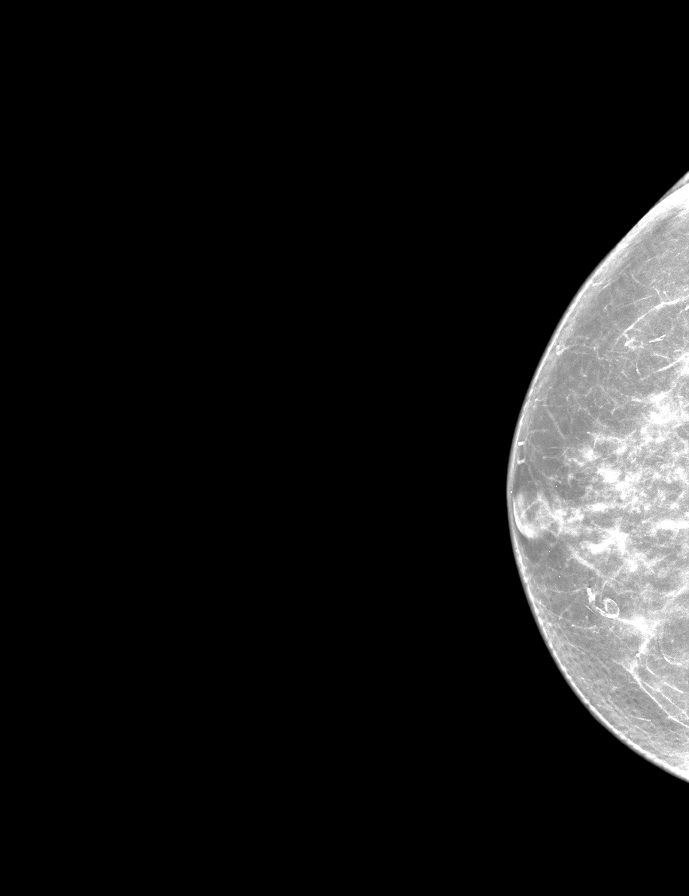

[R MLO]
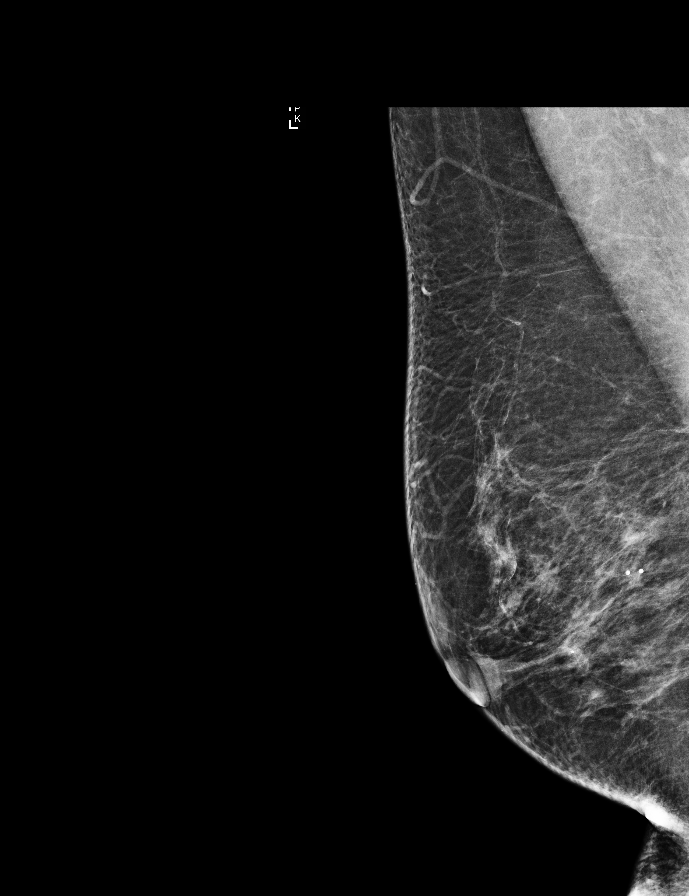

[L MLO]
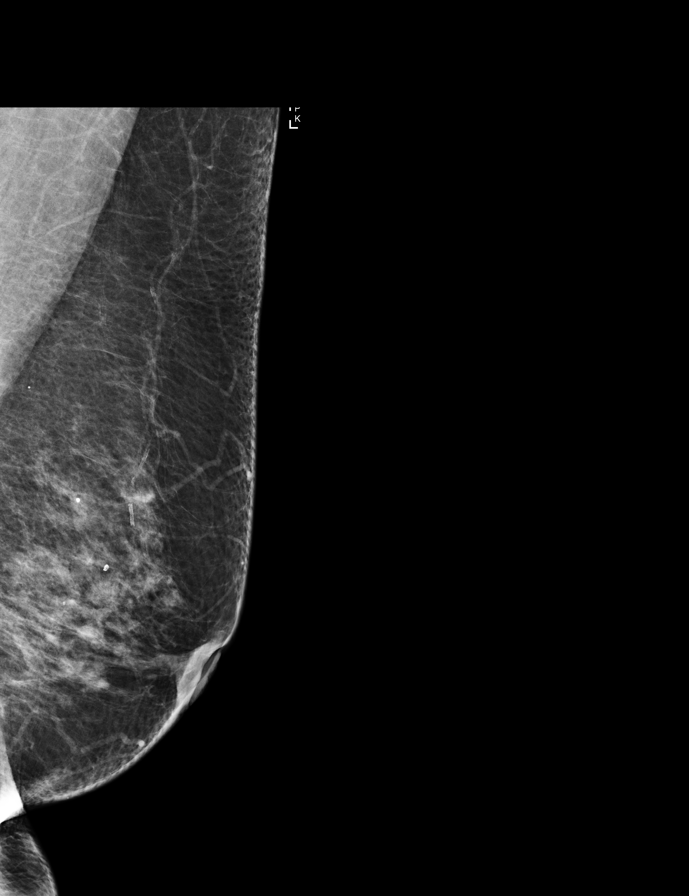

[R MLO synth-2D]
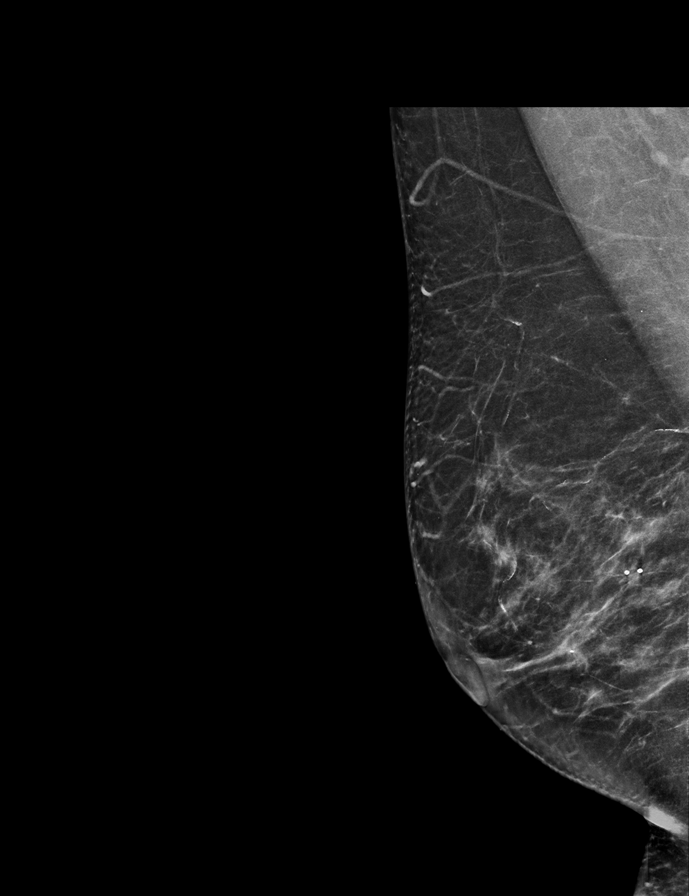

[L CC tomo · tomo slice 31/62.0]
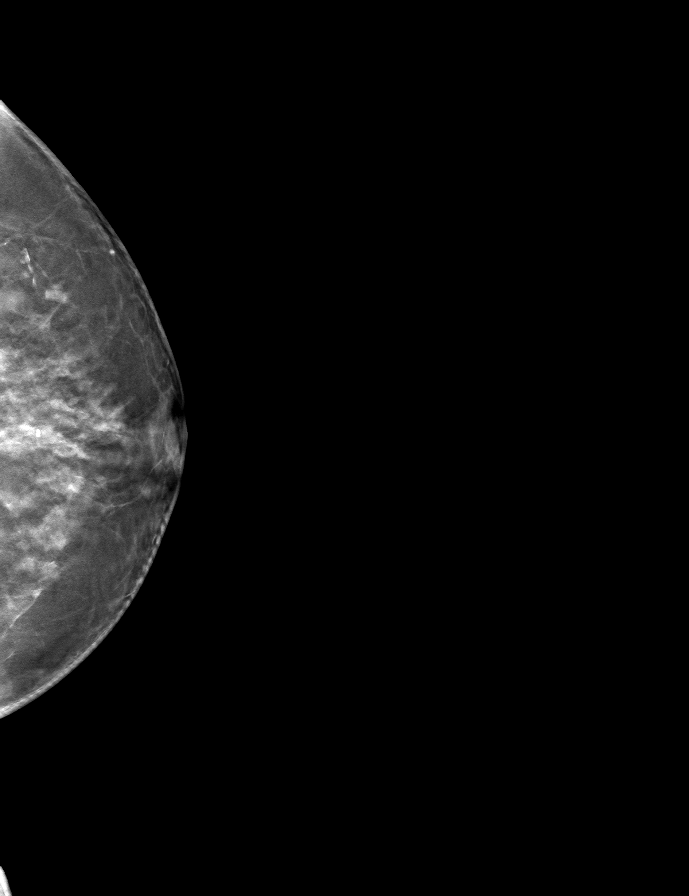

[9 of 28 positions shown; findings below may reference images not displayed]

ACR Breast Density Category c: The breast tissue is heterogeneously
dense, which may obscure small masses.
FINDINGS: There are no findings suspicious for malignancy. Images were
processed with CAD.
IMPRESSION: No mammographic evidence of malignancy. A result letter of this
screening mammogram will be mailed directly to the patient.

RECOMMENDATION:
Screening mammogram in one year. (Code:TN-[id number])

BI-RADS CATEGORY  1: Negative.

## 2017-10-04 DIAGNOSIS — M1711 Unilateral primary osteoarthritis, right knee: Secondary | ICD-10-CM | POA: Diagnosis not present

## 2017-10-06 ENCOUNTER — Encounter: Payer: Self-pay | Admitting: Cardiovascular Disease

## 2017-10-08 ENCOUNTER — Encounter: Payer: Self-pay | Admitting: Cardiovascular Disease

## 2017-10-08 ENCOUNTER — Ambulatory Visit (INDEPENDENT_AMBULATORY_CARE_PROVIDER_SITE_OTHER): Payer: Medicare Other | Admitting: Cardiovascular Disease

## 2017-10-08 VITALS — BP 130/72 | HR 70 | Ht 67.0 in | Wt 151.2 lb

## 2017-10-08 DIAGNOSIS — K219 Gastro-esophageal reflux disease without esophagitis: Secondary | ICD-10-CM | POA: Diagnosis not present

## 2017-10-08 DIAGNOSIS — R5383 Other fatigue: Secondary | ICD-10-CM

## 2017-10-08 DIAGNOSIS — R0609 Other forms of dyspnea: Secondary | ICD-10-CM

## 2017-10-08 DIAGNOSIS — I340 Nonrheumatic mitral (valve) insufficiency: Secondary | ICD-10-CM

## 2017-10-08 DIAGNOSIS — E7800 Pure hypercholesterolemia, unspecified: Secondary | ICD-10-CM | POA: Diagnosis not present

## 2017-10-08 DIAGNOSIS — Z79899 Other long term (current) drug therapy: Secondary | ICD-10-CM | POA: Diagnosis not present

## 2017-10-08 DIAGNOSIS — I10 Essential (primary) hypertension: Secondary | ICD-10-CM | POA: Diagnosis not present

## 2017-10-08 NOTE — Progress Notes (Signed)
Patient ID: Candice Richardson, female   DOB: 1945-10-27, 72 y.o.   MRN: [id number]    PCP: Dr. Dossie Arbour  HPI:  Candice Richardson is a 72 year old female who presents for a 12 month follow-up cardiology evaluation.  Candice Richardson has a history of hypertension, GERD, as well as hyperlipidemia.  Candice Richardson was told of having a heart murmur over 15 years ago when living in [place] and underwent frequent echo Doppler studies, as well as stress tests.  Candice Richardson does exercise regularly.  I first saw her in 2015 when Candice Richardson was referred to me by Dr. Jarold Motto after Candice Richardson has noticed some left sided chest pain, which he feels is more positional and muscular.   Candice Richardson described the discomfort at times as a "throbbing ache."  Sometimes, sitting makes it worse.  Candice Richardson denied a clear-cut exertional precipitation.  I saw her prior to her planned travel and was on amlodipine 5 mg and Lopressor 50 mg daily for blood pressure control.  Candice Richardson has a history of GERD which has benefited from omeprazole.  Candice Richardson had not had any subsequent echo Doppler studies or stress test since Candice Richardson moved to the [place] area from [place] several years ago.  When I saw her, I recommend over-the-counter Aleve 400 mg twice a day for 3-4 days and then one tablet twice a day.  This resolved her probable musculoskeletal chest pain.  Candice Richardson underwent  a routine treadmill test and reportedly had approximately 2 mm of horizontal ST depression that was completely asymptomatic at peak stress achieving a workload of 11 mets maximal heart rate of 136 beats per minute, representing 90% of predicted maximum.  Subsequently, Candice Richardson a nuclear myocardial perfusion was entirely normal and showed normal LV function, normal wall motion and a post-rest ejection fraction of 69%.  Perfusion was normal to all regions and no significant ECG changes.developed. An echo Doppler study revealed an ejection fraction of 55-60% without wall motion abnormalities.  Candice Richardson had normal diastolic  parameters. Her mitral valve was mildly thickened with mild regurgitation and there was mild TR.  There was mild pulmonary hypertension with estimated PA pressure 37.  Candice Richardson underwent left knee replacement in December 2017 by Dr. Valma Cava.  Candice Richardson had difficulty sleeping for approximate 4 months after her knee surgery.   Presently, Candice Richardson typically goes to bed at 11:30 and wakes up at 7:30 in the morning.  Candice Richardson denies any episodes of chest tightness.    Since I last saw her a 2018, midst to having more fatigue.  Denies any definitive chest pain but admits to increasing shortness of breath with exertion.  Is a very strong family history for CAD in 2018 her sister suffered a myocardial infarction and had 3 stents.  Her brothers had a pacemaker and is suffered an MI.  Another brother died and previously it had CABG revascularization surgery.  Her mother is also had a myocardial infarction.  Oct 17, 2017 Candice Richardson is leaving for [place] for 18 days with her grandchildren and has extensive travel planned angling, [place], [place], [place], [place], and [place].  Candice Richardson is concerned about her shortness of breath.  Candice Richardson has continued to be on amlodipine 5 mg, Toprol-XL 50 mg daily and also is on rosuvastatin 20 mg for hyperlipidemia.  Candice Richardson presents for evaluation.   Past Medical History:  Diagnosis Date  . Arthritis   . GERD (gastroesophageal reflux disease)   . Heart murmur   . Hyperlipidemia   . Hypertension   . Torn  meniscus     Past Surgical History:  Procedure Laterality Date  . APPENDECTOMY  1947   25 months old  . TOTAL KNEE ARTHROPLASTY Left 05/08/2016   Procedure: LEFT TOTAL KNEE ARTHROPLASTY;  Surgeon: Eugenia Mcalpine, MD;  Location: [facility];  Service: Orthopedics;  Laterality: Left;    No Known Allergies  Current Outpatient Medications  Medication Sig Dispense Refill  . amLODipine (NORVASC) 5 MG tablet Take 5 mg by mouth every evening.     Marland Kitchen amoxicillin (AMOXIL) 500 MG tablet TK 4 TS PO 1 TO 2 HOURS  PRIOR TO DENTAL WORK  3  . Calcium Carbonate-Vitamin D (CALTRATE 600+D PO) Take 1 tablet by mouth 2 (two) times daily.    . CO ENZYME Q-10 PO CoQ10 10 mg capsule  Take by oral route.    . meloxicam (MOBIC) 15 MG tablet meloxicam 15 mg tablet    . metoprolol succinate (TOPROL-XL) 50 MG 24 hr tablet Take 50 mg by mouth daily. Take with or immediately following a meal.    . Multiple Vitamins-Minerals (MULTIVITAMIN WITH MINERALS) tablet Take 1 tablet by mouth daily.    Marland Kitchen omeprazole (PRILOSEC) 20 MG capsule Take 20 mg by mouth every evening.     Marland Kitchen PREMARIN vaginal cream Place 1 application vaginally every 14 (fourteen) days.     . rosuvastatin (CRESTOR) 20 MG tablet Take 20 mg by mouth daily.  2  . vitamin B-12 (CYANOCOBALAMIN) 500 MCG tablet Take 1,000 mcg by mouth daily.      Current Facility-Administered Medications  Medication Dose Route Frequency Provider Last Rate Last Dose  . 0.9 %  sodium chloride infusion  500 mL Intravenous Continuous Hilarie Fredrickson, MD        Socially, Candice Richardson was originally from [place].  Candice Richardson lived in [place] for approximately 10-15 years.  Candice Richardson is widowed.  Candice Richardson does travel.  Candice Richardson has 2 children and 4 grandchildren.  Candice Richardson does drink occasional wine.  There is no tobacco use  Family History  Problem Relation Age of Onset  . Heart disease Mother   . Stomach cancer Father   . Colon polyps Brother   . Colon cancer Cousin   . Esophageal cancer Neg Hx   . Rectal cancer Neg Hx   . Pancreatic cancer Neg Hx     ROS General: Negative; No fevers, chills, or night sweats.  Positive for fatigue HEENT: Negative; No changes in vision or hearing, sinus congestion, difficulty swallowing Pulmonary: Negative; No cough, wheezing, shortness of breath, hemoptysis Cardiovascular: Positive for left-sided chest pain, no presyncope, syncope, palpatations GI: Positive for GERD No nausea, vomiting, diarrhea, or abdominal pain GU: Negative; No dysuria, hematuria, or difficulty  voiding Musculoskeletal: Status post left knee replacement December 2017 Hematologic/Oncologic: Negative; no easy bruising, bleeding Endocrine: Negative; no heat/cold intolerance; no diabetes Neuro: Negative; no changes in balance, headaches Skin: Negative; No rashes or skin lesions Psychiatric: Negative; No behavioral problems, depression Sleep: Positive for fatigue.; occasional daytime sleepiness, no bruxism, restless legs, hypnogagnic hallucinations Other comprehensive 14 point system review is negative  Physical Exam BP 130/72 (BP Location: Left Arm)   Pulse 70   Ht  (1.702 m)   Wt 151 lb 3.2 oz (68.6 kg)   BMI 23.68 kg/m    Repeat blood pressure was 130/74 supine and 124/74 standing.  Wt Readings from Last 3 Encounters:  10/08/17 151 lb 3.2 oz (68.6 kg)  12/16/16 150 lb (68 kg)  12/02/16 152 lb 12.8 oz (69.3  kg)    General: Alert, oriented, no distress.  Skin: normal turgor, no rashes, warm and dry HEENT: Normocephalic, atraumatic. Pupils equal round and reactive to light; sclera anicteric; extraocular muscles intact;  Nose without nasal septal hypertrophy Mouth/Parynx benign; Mallinpatti scale 2 Neck: No JVD, no carotid bruits; normal carotid upstroke Lungs: clear to ausculatation and percussion; no wheezing or rales Chest wall: without tenderness to palpitation Heart: PMI not displaced, RRR, s1 s2 normal, 1/6 systolic murmur, no diastolic murmur, no rubs, gallops, thrills, or heaves Abdomen: soft, nontender; no hepatosplenomehaly, BS+; abdominal aorta nontender and not dilated by palpation. Back: no CVA tenderness Pulses 2+ Musculoskeletal: full range of motion, normal strength, no joint deformities Extremities: no clubbing cyanosis or edema, Homan's sign negative  Neurologic: grossly nonfocal; Cranial nerves grossly wnl Psychologic: Normal mood and affect   ECG (independently read by me): Sinus rhythm at 70 bpm.  Normal intervals.  No ectopy.  May 2018 ECG  (independently read by me): Normal sinus rhythm at 73 bpm.  Normal intervals.  No ST segment changes.  September 2017 ECG (independently read by me): Normal sinus rhythm at 71 bpm.  No ectopy.  Normal intervals.  August 2016 ECG (independently read by me): Normal sinus rhythm at 60 bpm.  No ectopy.  Normal intervals.  May 2015  ECG (independently read by me): Normal sinus rhythm at 73 beats per minute.  No ectopy.  Normal intervals.  LABS: I reiewed laboratory by Dr. Jarold Motto from November 16, 2016.  Cholesterol 178, HDL 53, LDL 93, triglycerides 158.  In 0.8.  Hemoglobin 14.9.  BMP Latest Ref Rng & Units 05/10/2016 05/09/2016 05/08/2016  Glucose 65 - 99 mg/dL 161(W) 960(A) -  BUN 6 - 20 mg/dL 15 13 -  Creatinine 5.40 - 1.00 mg/dL 9.81 1.91 4.78  Sodium 135 - 145 mmol/L 136 136 -  Potassium 3.5 - 5.1 mmol/L 4.0 4.1 -  Chloride 101 - 111 mmol/L 104 104 -  CO2 22 - 32 mmol/L 27 23 -  Calcium 8.9 - 10.3 mg/dL 2.9(F) 6.2(Z) -   No flowsheet data found. No results found for: TSH  Lipid Panel  No results found for: CHOL, TRIG, HDL, CHOLHDL, VLDL, LDLCALC, LDLDIRECT  RADIOLOGY: No results found.  IMPRESSION:  1. Essential hypertension   2. Dyspnea on exertion   3. Pure hypercholesterolemia   4. Fatigue, unspecified type   5. Medication management   6. Mild mitral regurgitation   7. Gastroesophageal reflux disease without esophagitis     ASSESSMENT AND PLAN: [name] is a 72 year-old female who has a history of hypertension, hyperlipidemia, with cholesterols as high as 240 in the past, as well as GERD. Remotely, Candice Richardson had taken simvastatin for hyperlipidemia but also this was changed to Crestor 20 mg.  In the past Candice Richardson was told that her lipids were excellent.  LDL most recently in June 2018 was 93.  Blood pressure today is stable on amlodipine and metoprolol.  Candice Richardson continues to be on rosuvastatin for hyperlipidemia.  Candice Richardson has GERD and this has been controlled with omeprazole.  No  significant change in her physical exam he has previously documented mild mitral and tricuspid regurgitation.  Candice Richardson recently experienced increasing shortness of breath with exertion.  Candice Richardson feels more tired.  Candice Richardson denies specific chest tightness.  Candice Richardson does have very significant family history for CAD.  Prior to her trip in [place], I have suggested Candice Richardson undergo an echo Doppler study.  I will also schedule her for  an exercise Myoview study.  This will be scheduled for next week and I plan to see her as a work in on 01/15/2018 prior to her departure. Spent: 25 minutes  Lennette Bihari, MD, [facility] 10/10/2017 10:08 AM

## 2017-10-08 NOTE — Patient Instructions (Addendum)
Medication Instructions:  Your physician recommends that you continue on your current medications as directed. Please refer to the Current Medication list given to you today.  Labwork: Please return for FASTING labs next week (CMET, CBC, Lipid, TSH)  Our in office lab hours are Monday-Friday 8:00-4:00, closed for lunch 12:45-1:45 pm.  No appointment needed.  Testing/Procedures: Your physician has requested that you have an echocardiogram NEXT WEEK. Echocardiography is a painless test that uses sound waves to create images of your heart. It provides your doctor with information about the size and shape of your heart and how well your heart's chambers and valves are working. This procedure takes approximately one hour. There are no restrictions for this procedure.  Your physician has requested that you have en exercise stress myoview NEXT WEEK. For further information please visit [email or web address] Please follow instruction sheet, as given.  --hold metoprolol day before and morning of test  Follow-Up: 5/24 with Dr. Tresa Endo  Any Other Special Instructions Will Be Listed Below (If Applicable).     If you need a refill on your cardiac medications before your next appointment, please call your pharmacy.

## 2017-10-10 ENCOUNTER — Encounter: Payer: Self-pay | Admitting: Cardiovascular Disease

## 2017-10-11 DIAGNOSIS — I10 Essential (primary) hypertension: Secondary | ICD-10-CM | POA: Diagnosis not present

## 2017-10-11 DIAGNOSIS — E7800 Pure hypercholesterolemia, unspecified: Secondary | ICD-10-CM | POA: Diagnosis not present

## 2017-10-11 DIAGNOSIS — R0609 Other forms of dyspnea: Secondary | ICD-10-CM | POA: Diagnosis not present

## 2017-10-11 DIAGNOSIS — R5383 Other fatigue: Secondary | ICD-10-CM | POA: Diagnosis not present

## 2017-10-11 LAB — COMPREHENSIVE METABOLIC PANEL
A/G RATIO: 1.8 (ref 1.2–2.2)
ALT: 18 IU/L (ref 0–32)
AST: 19 IU/L (ref 0–40)
Albumin: 4.2 g/dL (ref 3.5–4.8)
Alkaline Phosphatase: 65 IU/L (ref 39–117)
BUN/Creatinine Ratio: 22 (ref 12–28)
BUN: 18 mg/dL (ref 8–27)
Bilirubin Total: 1 mg/dL (ref 0.0–1.2)
CALCIUM: 9.5 mg/dL (ref 8.7–10.3)
CO2: 25 mmol/L (ref 20–29)
CREATININE: 0.82 mg/dL (ref 0.57–1.00)
Chloride: 101 mmol/L (ref 96–106)
GFR calc Af Amer: 82 mL/min/{1.73_m2} (ref >59)
GFR, EST NON AFRICAN AMERICAN: 71 mL/min/{1.73_m2} (ref >59)
Globulin, Total: 2.4 g/dL (ref 1.5–4.5)
Glucose: 91 mg/dL (ref 65–99)
POTASSIUM: 4.4 mmol/L (ref 3.5–5.2)
Sodium: 140 mmol/L (ref 134–144)
Total Protein: 6.6 g/dL (ref 6.0–8.5)

## 2017-10-11 LAB — TSH: TSH: 4.36 u[IU]/mL (ref 0.450–4.500)

## 2017-10-11 LAB — CBC
HEMATOCRIT: 41.9 % (ref 34.0–46.6)
HEMOGLOBIN: 14.7 g/dL (ref 11.1–15.9)
MCH: 32.7 pg (ref 26.6–33.0)
MCHC: 35.1 g/dL (ref 31.5–35.7)
MCV: 93 fL (ref 79–97)
Platelets: 253 10*3/uL (ref 150–450)
RBC: 4.5 x10E6/uL (ref 3.77–5.28)
RDW: 13.3 % (ref 12.3–15.4)
WBC: 7.3 10*3/uL (ref 3.4–10.8)

## 2017-10-11 LAB — LIPID PANEL
CHOL/HDL RATIO: 2.7 ratio (ref 0.0–4.4)
Cholesterol, Total: 160 mg/dL (ref 100–199)
HDL: 59 mg/dL (ref >39)
LDL CALC: 79 mg/dL (ref 0–99)
Triglycerides: 110 mg/dL (ref 0–149)
VLDL CHOLESTEROL CAL: 22 mg/dL (ref 5–40)

## 2017-10-12 ENCOUNTER — Telehealth (HOSPITAL_COMMUNITY): Payer: Self-pay

## 2017-10-12 ENCOUNTER — Other Ambulatory Visit: Payer: Self-pay

## 2017-10-12 ENCOUNTER — Ambulatory Visit (HOSPITAL_COMMUNITY): Payer: Medicare Other | Attending: Cardiovascular Disease

## 2017-10-12 DIAGNOSIS — R011 Cardiac murmur, unspecified: Secondary | ICD-10-CM | POA: Diagnosis not present

## 2017-10-12 DIAGNOSIS — I10 Essential (primary) hypertension: Secondary | ICD-10-CM | POA: Insufficient documentation

## 2017-10-12 DIAGNOSIS — R079 Chest pain, unspecified: Secondary | ICD-10-CM | POA: Insufficient documentation

## 2017-10-12 DIAGNOSIS — E785 Hyperlipidemia, unspecified: Secondary | ICD-10-CM | POA: Insufficient documentation

## 2017-10-12 DIAGNOSIS — R0609 Other forms of dyspnea: Secondary | ICD-10-CM | POA: Insufficient documentation

## 2017-10-12 DIAGNOSIS — Z8249 Family history of ischemic heart disease and other diseases of the circulatory system: Secondary | ICD-10-CM | POA: Insufficient documentation

## 2017-10-12 NOTE — Telephone Encounter (Signed)
Encounter complete. 

## 2017-10-14 ENCOUNTER — Ambulatory Visit (HOSPITAL_COMMUNITY)
Admission: RE | Admit: 2017-10-14 | Discharge: 2017-10-14 | Disposition: A | Discharge status: Home / Self Care | Payer: Medicare Other | Source: Ambulatory Visit | Attending: Internal Medicine | Admitting: Internal Medicine

## 2017-10-14 DIAGNOSIS — R0609 Other forms of dyspnea: Secondary | ICD-10-CM

## 2017-10-14 LAB — MYOCARDIAL PERFUSION IMAGING
CHL CUP MPHR: 146 {beats}/min
CHL RATE OF PERCEIVED EXERTION: 18
CSEPED: 7 min
CSEPEW: 8.9 METS
Exercise duration (sec): 15 s
LV dias vol: 55 mL (ref 46–106)
LVSYSVOL: 16 mL
Peak HR: 134 {beats}/min
Percent HR: 91 %
Rest HR: 73 {beats}/min
SDS: 0
SRS: 0
SSS: 0
TID: 1.13

## 2017-10-14 MED ORDER — TECHNETIUM TC 99M TETROFOSMIN IV KIT
11.0000 | PACK | Freq: Once | INTRAVENOUS | Status: AC | PRN
  Administered 2017-10-14: 11 via INTRAVENOUS
  Filled 2017-10-14: qty 11

## 2017-10-14 MED ORDER — TECHNETIUM TC 99M TETROFOSMIN IV KIT
29.5000 | PACK | Freq: Once | INTRAVENOUS | Status: AC | PRN
Start: 2017-10-14 — End: 2017-10-14
  Administered 2017-10-14: 29.5 via INTRAVENOUS
  Filled 2017-10-14: qty 30

## 2017-10-15 ENCOUNTER — Encounter: Payer: Self-pay | Admitting: Cardiovascular Disease

## 2017-10-15 ENCOUNTER — Ambulatory Visit: Payer: Medicare Other | Admitting: Cardiovascular Disease

## 2017-10-15 VITALS — BP 150/76 | HR 72 | Ht 67.0 in | Wt 149.8 lb

## 2017-10-15 DIAGNOSIS — E7800 Pure hypercholesterolemia, unspecified: Secondary | ICD-10-CM

## 2017-10-15 DIAGNOSIS — R0609 Other forms of dyspnea: Secondary | ICD-10-CM

## 2017-10-15 DIAGNOSIS — R5383 Other fatigue: Secondary | ICD-10-CM | POA: Diagnosis not present

## 2017-10-15 MED ORDER — HYDROCHLOROTHIAZIDE 12.5 MG PO CAPS: 12.5000 mg | ORAL_CAPSULE | ORAL | 3 refills | Status: DC | PRN

## 2017-10-15 NOTE — Patient Instructions (Signed)
Medication Instructions:  HCTZ 12.5 mg as needed for swelling --take dose today  Follow-Up: Your physician wants you to follow-up in: 6 months with Dr. Tresa Endo. You will receive a reminder letter in the mail two months in advance. If you don't receive a letter, please call our office to schedule the follow-up appointment.  Any Other Special Instructions Will Be Listed Below (If Applicable).  Use compression stockings while traveling   If you need a refill on your cardiac medications before your next appointment, please call your pharmacy.

## 2017-10-17 ENCOUNTER — Encounter: Payer: Self-pay | Admitting: Cardiovascular Disease

## 2017-10-17 NOTE — Progress Notes (Signed)
Patient ID: Candice Richardson, female   DOB: [birth year], 72 y.o.   MRN: [id number]    PCP: Dr. Janie Morning  HPI:  Candice Richardson is a 72 year old female who presents for a one week follow-up cardiology evaluation.  Candice Richardson has a history of hypertension, GERD, as well as hyperlipidemia.  She was told of having a heart murmur over 15 years ago when living in [place] and underwent frequent echo Doppler studies, as well as stress tests.  She does exercise regularly.  I first saw her in 2015 when she was referred to me by Dr. Sharlett Iles after she has noticed some left sided chest pain, which he feels is more positional and muscular.   She described the discomfort at times as a "throbbing ache."  Sometimes, sitting makes it worse.  She denied a clear-cut exertional precipitation.  I saw her prior to her planned travel and was on amlodipine 5 mg and Lopressor 50 mg daily for blood pressure control.  She has a history of GERD which has benefited from omeprazole.  She had not had any subsequent echo Doppler studies or stress test since she moved to the [place] area from [place] several years ago.  When I saw her, I recommend over-the-counter Aleve 400 mg twice a day for 3-4 days and then one tablet twice a day.  This resolved her probable musculoskeletal chest pain.  She underwent  a routine treadmill test and reportedly had approximately 2 mm of horizontal ST depression that was completely asymptomatic at peak stress achieving a workload of 11 mets maximal heart rate of 136 beats per minute, representing 90% of predicted maximum.  Subsequently, she a nuclear myocardial perfusion was entirely normal and showed normal LV function, normal wall motion and a post-rest ejection fraction of 69%.  Perfusion was normal to all regions and no significant ECG changes.developed. An echo Doppler study revealed an ejection fraction of 55-60% without wall motion abnormalities.  She had normal diastolic  parameters. Her mitral valve was mildly thickened with mild regurgitation and there was mild TR.  There was mild pulmonary hypertension with estimated PA pressure 37.  She underwent left knee replacement in December 2017 by Dr. Hart Robinsons.  She had difficulty sleeping for approximate 4 months after her knee surgery.   Presently, she typically goes to bed at 11:30 and wakes up at 7:30 in the morning.  She denies any episodes of chest tightness.    When  I saw her on May 2019 for a one year evaluation she was complaining of more fatigue. ore fatigue.  She denied any definitive chest pain but admits to increasing shortness of breath with exertion.  She has a very strong family history for CAD and in 2018 her sister suffered a myocardial infarction and had 3 stents.  Her brother had a pacemaker and is suffered an MI.  Another brother died and previously had CABG revascularization surgery.  Her mother is also had a myocardial infarction.  On Oct 17, 2017 she was planning to leave for [place] for 18 days with her grandchildren and has extensive travel planned angling, [place], [place], [place], [place], and [place].  She is concerned about her shortness of breath.  She has continued to be on amlodipine 5 mg, Toprol-XL 50 mg daily and also is on rosuvastatin 20 mg for hyperlipidemia.    In light of her change in symptomatology I recommended that she undergo laboratory, a 2D echo Doppler study and nuclear stress test prior  to her planned departure.  TSH was normal at 4.36.  Glucose was excellent at 91.  She had normal renal function and LFTs.  Hemoglobin and hematocrit were stable at 14.7 and 41.9.  Lipid studies were very good with total cholesterol 160, LDL cholesterol 79, triglycerides 110.  An echo Doppler study showed an EF of 55 to 60% without wall motion abnormalities.  She had mild pulmonary hypertension with a PA peak pressure 39 mm.  A nuclear stress test was low risk and showed hyperdynamic LV function  with an EF of 72%.  There were no ECG changes.  There was normal perfusion.  She is concerned that she often developed some leg swelling with extensive travel.  She presents for evaluation.   Past Medical History:  Diagnosis Date  . Arthritis   . GERD (gastroesophageal reflux disease)   . Heart murmur   . Hyperlipidemia   . Hypertension   . Torn meniscus     Past Surgical History:  Procedure Laterality Date  . APPENDECTOMY  1947   18 months old  . TOTAL KNEE ARTHROPLASTY Left 05/08/2016   Procedure: LEFT TOTAL KNEE ARTHROPLASTY;  Surgeon: Sydnee Cabal, MD;  Location: [facility];  Service: Orthopedics;  Laterality: Left;    No Known Allergies  Current Outpatient Medications  Medication Sig Dispense Refill  . amLODipine (NORVASC) 5 MG tablet Take 5 mg by mouth every evening.     Marland Kitchen amoxicillin (AMOXIL) 500 MG tablet TK 4 TS PO 1 TO 2 HOURS PRIOR TO DENTAL WORK  3  . Calcium Carbonate-Vitamin D (CALTRATE 600+D PO) Take 1 tablet by mouth 2 (two) times daily.    . CO ENZYME Q-10 PO CoQ10 10 mg capsule  Take by oral route.    . meloxicam (MOBIC) 15 MG tablet meloxicam 15 mg tablet    . metoprolol succinate (TOPROL-XL) 50 MG 24 hr tablet Take 50 mg by mouth daily. Take with or immediately following a meal.    . Multiple Vitamins-Minerals (MULTIVITAMIN WITH MINERALS) tablet Take 1 tablet by mouth daily.    Marland Kitchen omeprazole (PRILOSEC) 20 MG capsule Take 20 mg by mouth every evening.     Marland Kitchen PREMARIN vaginal cream Place 1 application vaginally every 14 (fourteen) days.     . rosuvastatin (CRESTOR) 20 MG tablet Take 20 mg by mouth daily.  2  . vitamin B-12 (CYANOCOBALAMIN) 500 MCG tablet Take 1,000 mcg by mouth daily.     . hydrochlorothiazide (MICROZIDE) 12.5 MG capsule Take 1 capsule (12.5 mg total) by mouth as needed (swelling). 30 capsule 3   Current Facility-Administered Medications  Medication Dose Route Frequency Provider Last Rate Last Dose  . 0.9 %  sodium chloride infusion  500 mL  Intravenous Continuous Irene Shipper, MD        Socially, she was originally from [place].  She lived in [place] for approximately 10-15 years.  She is widowed.  She does travel.  She has 2 children and 4 grandchildren.  She does drink occasional wine.  There is no tobacco use  Family History  Problem Relation Age of Onset  . Heart disease Mother   . Stomach cancer Father   . Colon polyps Brother   . Colon cancer Cousin   . Esophageal cancer Neg Hx   . Rectal cancer Neg Hx   . Pancreatic cancer Neg Hx     ROS General: Negative; No fevers, chills, or night sweats.  Positive for fatigue HEENT: Negative; No changes  in vision or hearing, sinus congestion, difficulty swallowing Pulmonary: Negative; No cough, wheezing, shortness of breath, hemoptysis Cardiovascular: Positive for left-sided chest pain, no presyncope, syncope, palpatations GI: Positive for GERD No nausea, vomiting, diarrhea, or abdominal pain GU: Negative; No dysuria, hematuria, or difficulty voiding Musculoskeletal: Status post left knee replacement December 2017 Hematologic/Oncologic: Negative; no easy bruising, bleeding Endocrine: Negative; no heat/cold intolerance; no diabetes Neuro: Negative; no changes in balance, headaches Skin: Negative; No rashes or skin lesions Psychiatric: Negative; No behavioral problems, depression Sleep: Positive for fatigue.; occasional daytime sleepiness, no bruxism, restless legs, hypnogagnic hallucinations Other comprehensive 14 point system review is negative  Physical Exam BP (!) 150/76   Pulse 72   Ht '5\' 7"'$  (1.702 m)   Wt 149 lb 12.8 oz (67.9 kg)   BMI 23.46 kg/m   Repeat blood pressure by me was 140/76.  Wt Readings from Last 3 Encounters:  10/15/17 149 lb 12.8 oz (67.9 kg)  10/14/17 151 lb (68.5 kg)  10/08/17 151 lb 3.2 oz (68.6 kg)   General: Alert, oriented, no distress.  Skin: normal turgor, no rashes, warm and dry HEENT: Normocephalic, atraumatic. Pupils  equal round and reactive to light; sclera anicteric; extraocular muscles intact;  Nose without nasal septal hypertrophy Mouth/Parynx benign; Mallinpatti scale Neck: No JVD, no carotid bruits; normal carotid upstroke Lungs: clear to ausculatation and percussion; no wheezing or rales Chest wall: without tenderness to palpitation Heart: PMI not displaced, RRR, s1 s2 normal, 1/6 systolic murmur, no diastolic murmur, no rubs, gallops, thrills, or heaves Abdomen: soft, nontender; no hepatosplenomehaly, BS+; abdominal aorta nontender and not dilated by palpation. Back: no CVA tenderness Pulses 2+ Musculoskeletal: full range of motion, normal strength, no joint deformities Extremities: no clubbing cyanosis or edema, Homan's sign negative  Neurologic: grossly nonfocal; Cranial nerves grossly wnl Psychologic: Normal mood and affect   ECG (independently read by me): Sinus rhythm at 70 bpm.  Normal intervals.  No ectopy.  May 2018 ECG (independently read by me): Normal sinus rhythm at 73 bpm.  Normal intervals.  No ST segment changes.  September 2017 ECG (independently read by me): Normal sinus rhythm at 71 bpm.  No ectopy.  Normal intervals.  August 2016 ECG (independently read by me): Normal sinus rhythm at 60 bpm.  No ectopy.  Normal intervals.  May 2015  ECG (independently read by me): Normal sinus rhythm at 73 beats per minute.  No ectopy.  Normal intervals.  LABS: I reiewed laboratory by Dr. Sharlett Iles from November 16, 2016.  Cholesterol 178, HDL 53, LDL 93, triglycerides 158.  In 0.8.  Hemoglobin 14.9.  BMP Latest Ref Rng & Units 10/11/2017 05/10/2016 05/09/2016  Glucose 65 - 99 mg/dL 91 121(H) 134(H)  BUN 8 - 27 mg/dL '18 15 13  '$ Creatinine 0.57 - 1.00 mg/dL 0.82 0.57 0.56  BUN/Creat Ratio 12 - 28 22 - -  Sodium 134 - 144 mmol/L 140 136 136  Potassium 3.5 - 5.2 mmol/L 4.4 4.0 4.1  Chloride 96 - 106 mmol/L 101 104 104  CO2 20 - 29 mmol/L '25 27 23  '$ Calcium 8.7 - 10.3 mg/dL 9.5 8.5(L) 8.5(L)     Hepatic Function Latest Ref Rng & Units 10/11/2017  Total Protein 6.0 - 8.5 g/dL 6.6  Albumin 3.5 - 4.8 g/dL 4.2  AST 0 - 40 IU/L 19  ALT 0 - 32 IU/L 18  Alk Phosphatase 39 - 117 IU/L 65  Total Bilirubin 0.0 - 1.2 mg/dL 1.0   Lab Results  Component  Value Date   TSH 4.360 10/11/2017    Lipid Panel     Component Value Date/Time   CHOL 160 10/11/2017 0850   TRIG 110 10/11/2017 0850   HDL 59 10/11/2017 0850   CHOLHDL 2.7 10/11/2017 0850   LDLCALC 79 10/11/2017 0850    RADIOLOGY: No results found.  IMPRESSION:  1. Dyspnea on exertion   2. Fatigue, unspecified type   3. Pure hypercholesterolemia     ASSESSMENT AND PLAN: [name] is a 72 year-old female who has a history of hypertension, hyperlipidemia, with cholesterols as high as 240 in the past, as well as GERD. Remotely, she had taken simvastatin for hyperlipidemia but also this was changed to Crestor 20 mg.  She had recently noticed more fatigability and some shortness of breath with fast walking.  In light of her upcoming extensive travel, I scheduled her for a nuclear stress test as well as echo Doppler study.  I reviewed the studies with her in detail today.  Her echo Doppler study shows normal LV function and was without regional wall motion abnormalities.  Her nuclear stress test shows normal perfusion.  Of note, her peak blood pressure was exaggerated 209/64 during her exercise test.  She states she typically develops leg swelling with long travel.  I have suggested she take HCTZ 12.5 mg today particularly with her mild blood pressure elevation and then take this as needed or every other day.  I also have suggested she wear compression stockings on her long airplane flights and if there is extensive training travel during her trip.  I reviewed her laboratory.  She has normal thyroid function.  She is not anemic.  Lipid studies are very good and she is tolerating Crestor 20 mg.  She will continue amlodipine 5 mg metoprolol  succinate 50 mg and will start the HCTZ as prescribed above.  I will see her in 6 months for reevaluation.  Troy Sine, MD, [facility] 10/17/2017 11:00 AM

## 2017-11-08 DIAGNOSIS — M25561 Pain in right knee: Secondary | ICD-10-CM | POA: Diagnosis not present

## 2017-11-15 DIAGNOSIS — S83241A Other tear of medial meniscus, current injury, right knee, initial encounter: Secondary | ICD-10-CM | POA: Diagnosis not present

## 2017-11-15 DIAGNOSIS — M2241 Chondromalacia patellae, right knee: Secondary | ICD-10-CM | POA: Diagnosis not present

## 2017-11-23 DIAGNOSIS — H9193 Unspecified hearing loss, bilateral: Secondary | ICD-10-CM | POA: Diagnosis not present

## 2017-11-23 DIAGNOSIS — R0609 Other forms of dyspnea: Secondary | ICD-10-CM | POA: Diagnosis not present

## 2017-11-23 DIAGNOSIS — Z0000 Encounter for general adult medical examination without abnormal findings: Secondary | ICD-10-CM | POA: Diagnosis not present

## 2017-11-23 DIAGNOSIS — M542 Cervicalgia: Secondary | ICD-10-CM | POA: Diagnosis not present

## 2017-12-09 DIAGNOSIS — N907 Vulvar cyst: Secondary | ICD-10-CM | POA: Diagnosis not present

## 2017-12-21 ENCOUNTER — Other Ambulatory Visit: Payer: Self-pay | Admitting: Obstetrics and Gynecology

## 2017-12-21 DIAGNOSIS — Z1231 Encounter for screening mammogram for malignant neoplasm of breast: Secondary | ICD-10-CM

## 2018-01-13 DIAGNOSIS — L658 Other specified nonscarring hair loss: Secondary | ICD-10-CM | POA: Diagnosis not present

## 2018-01-13 DIAGNOSIS — G4709 Other insomnia: Secondary | ICD-10-CM | POA: Diagnosis not present

## 2018-01-13 DIAGNOSIS — I10 Essential (primary) hypertension: Secondary | ICD-10-CM | POA: Diagnosis not present

## 2018-01-13 DIAGNOSIS — Z6824 Body mass index (BMI) 24.0-24.9, adult: Secondary | ICD-10-CM | POA: Diagnosis not present

## 2018-02-17 ENCOUNTER — Ambulatory Visit
Admission: RE | Admit: 2018-02-17 | Discharge: 2018-02-17 | Disposition: A | Discharge status: Home / Self Care | Payer: Medicare Other | Source: Ambulatory Visit | Attending: Obstetrics and Gynecology | Admitting: Obstetrics and Gynecology

## 2018-02-17 DIAGNOSIS — Z1231 Encounter for screening mammogram for malignant neoplasm of breast: Secondary | ICD-10-CM

## 2018-03-11 DIAGNOSIS — M1711 Unilateral primary osteoarthritis, right knee: Secondary | ICD-10-CM | POA: Diagnosis not present

## 2018-03-14 DIAGNOSIS — M542 Cervicalgia: Secondary | ICD-10-CM | POA: Diagnosis not present

## 2018-03-14 DIAGNOSIS — M5412 Radiculopathy, cervical region: Secondary | ICD-10-CM | POA: Diagnosis not present

## 2018-03-21 DIAGNOSIS — E7849 Other hyperlipidemia: Secondary | ICD-10-CM | POA: Diagnosis not present

## 2018-03-21 DIAGNOSIS — G4709 Other insomnia: Secondary | ICD-10-CM | POA: Diagnosis not present

## 2018-03-21 DIAGNOSIS — L658 Other specified nonscarring hair loss: Secondary | ICD-10-CM | POA: Diagnosis not present

## 2018-03-21 DIAGNOSIS — I10 Essential (primary) hypertension: Secondary | ICD-10-CM | POA: Diagnosis not present

## 2018-04-07 DIAGNOSIS — M47812 Spondylosis without myelopathy or radiculopathy, cervical region: Secondary | ICD-10-CM | POA: Insufficient documentation

## 2018-05-05 ENCOUNTER — Telehealth: Payer: Self-pay | Admitting: Cardiovascular Disease

## 2018-05-05 NOTE — Telephone Encounter (Signed)
Okay by me.

## 2018-05-05 NOTE — Telephone Encounter (Signed)
New message   Patient states that she would like to be released from Dr. Landry DykeKelly's  care and see Dr. Anne FuSkains. Please advise.

## 2018-05-06 NOTE — Telephone Encounter (Signed)
Ok with me Mark Skains, MD  

## 2018-05-26 DIAGNOSIS — Z6824 Body mass index (BMI) 24.0-24.9, adult: Secondary | ICD-10-CM | POA: Diagnosis not present

## 2018-05-26 DIAGNOSIS — J019 Acute sinusitis, unspecified: Secondary | ICD-10-CM | POA: Diagnosis not present

## 2018-05-26 DIAGNOSIS — R05 Cough: Secondary | ICD-10-CM | POA: Diagnosis not present

## 2018-06-01 DIAGNOSIS — S83241D Other tear of medial meniscus, current injury, right knee, subsequent encounter: Secondary | ICD-10-CM | POA: Diagnosis not present

## 2018-06-01 DIAGNOSIS — M1711 Unilateral primary osteoarthritis, right knee: Secondary | ICD-10-CM | POA: Diagnosis not present

## 2018-06-21 ENCOUNTER — Encounter: Payer: Self-pay | Admitting: Cardiology

## 2018-06-21 ENCOUNTER — Ambulatory Visit: Payer: Medicare Other | Admitting: Cardiology

## 2018-06-21 VITALS — BP 140/80 | HR 102 | Ht 67.0 in | Wt 151.6 lb

## 2018-06-21 DIAGNOSIS — R55 Syncope and collapse: Secondary | ICD-10-CM

## 2018-06-21 DIAGNOSIS — Z8249 Family history of ischemic heart disease and other diseases of the circulatory system: Secondary | ICD-10-CM | POA: Diagnosis not present

## 2018-06-21 DIAGNOSIS — I10 Essential (primary) hypertension: Secondary | ICD-10-CM | POA: Diagnosis not present

## 2018-06-21 NOTE — Patient Instructions (Signed)
Medication Instructions:  The current medical regimen is effective;  continue present plan and medications.  If you need a refill on your cardiac medications before your next appointment, please call your pharmacy.   Follow-Up: At [facility], you and your health needs are our priority.  As part of our continuing mission to provide you with exceptional heart care, we have created designated Provider Care Teams.  These Care Teams include your primary Cardiologist (physician) and Advanced Practice Providers (APPs -  Physician Assistants and Nurse Practitioners) who all work together to provide you with the care you need, when you need it. You will need a follow up appointment in 12 months.  Please call our office 2 months in advance to schedule this appointment.  You may see Mark Skains, MD or one of the following Advanced Practice Providers on your designated Care Team:   Lori Gerhardt, NP Laura Ingold, NP . Jill McDaniel, NP  Thank you for choosing [facility]!!      

## 2018-06-21 NOTE — Progress Notes (Signed)
Cardiology Office Note:    Date:  06/21/2018   ID:  Candice Richardson, Candice Richardson 1945-08-09, MRN [id number]  PCP:  Jarome Matin, MD  Cardiologist:  Donato Schultz, MD  Electrophysiologist:  None   Referring MD: Jarome Matin, MD     History of Present Illness:    Candice Richardson is a 73 y.o. female here for the evaluation of syncope and pre op evaluation.   Has seen Dr. Tresa Endo in the past 10/15/17 with HTN, GERD, hyperlipidemia.   Had frequent [facility] and stress tests in [place]. Was originally seen here by Dr. Tresa Endo in 2015, Dr. Jarold Motto referred. Left sided CP.    Had several tests. ECHO normal with mild PA pressure elevation. NUC stress EF 72 with no ischemia.Prior ETT with 12mm ST depression without symptoms.  LDL 79   She has a very strong family history for CAD and in 2018 her sister suffered a myocardial infarction and had 3 stents.  Her brother had a pacemaker and is suffered an MI.  Another brother died and previously had CABG revascularization surgery.  Her mother is also had a myocardial infarction  Blood work, driving home, felt faint, had to pull over, blamed it on blood. Feels a racing up sensation.  Rushing-like flush-like sensation.  Christmas. Daughter in law is NP was there. Took BP 178/90. Water was given. Has felt nausea but no sweat. Never fainted. Never had these before.   Recent bout with divertic. Different.  Does drink ETOH.  Wine.  Has been recently holding this.  Felt a little light headed even at meeting. 94bpm at rest.   Encouraged her to increase her fluid intake, water.  Past Medical History:  Diagnosis Date  . Arthritis   . GERD (gastroesophageal reflux disease)   . Heart murmur   . Hyperlipidemia   . Hypertension   . Torn meniscus     Past Surgical History:  Procedure Laterality Date  . APPENDECTOMY  1947   69 months old  . TOTAL KNEE ARTHROPLASTY Left 05/08/2016   Procedure: LEFT TOTAL KNEE ARTHROPLASTY;  Surgeon: Eugenia Mcalpine,  MD;  Location: [facility];  Service: Orthopedics;  Laterality: Left;    Current Medications: Current Meds  Medication Sig  . amLODipine (NORVASC) 5 MG tablet Take 5 mg by mouth every evening.   . Calcium Carbonate-Vitamin D (CALTRATE 600+D PO) Take 1 tablet by mouth daily.   . CO ENZYME Q-10 PO CoQ10 10 mg capsule  Take by oral route.  . metoprolol succinate (TOPROL-XL) 50 MG 24 hr tablet Take 50 mg by mouth daily. Take with or immediately following a meal.  . Multiple Vitamins-Minerals (MULTIVITAMIN WITH MINERALS) tablet Take 1 tablet by mouth daily.  Marland Kitchen omeprazole (PRILOSEC) 20 MG capsule Take 20 mg by mouth every evening.   Marland Kitchen PREMARIN vaginal cream Place 1 application vaginally every 14 (fourteen) days.   . rosuvastatin (CRESTOR) 20 MG tablet Take 20 mg by mouth daily.  . vitamin B-12 (CYANOCOBALAMIN) 500 MCG tablet Take 1,000 mcg by mouth daily.      Allergies:   Patient has no known allergies.   Social History   Socioeconomic History  . Marital status: Widowed    Spouse name: Not on file  . Number of children: Not on file  . Years of education: Not on file  . Highest education level: Not on file  Occupational History  . Not on file  Social Needs  . Financial resource strain: Not on file  .  Food insecurity:    Worry: Not on file    Inability: Not on file  . Transportation needs:    Medical: Not on file    Non-medical: Not on file  Tobacco Use  . Smoking status: Never Smoker  . Smokeless tobacco: Never Used  Substance and Sexual Activity  . Alcohol use: Yes    Alcohol/week: 9.0 standard drinks    Types: 9 Glasses of wine per week  . Drug use: No  . Sexual activity: Not on file  Lifestyle  . Physical activity:    Days per week: Not on file    Minutes per session: Not on file  . Stress: Not on file  Relationships  . Social connections:    Talks on phone: Not on file    Gets together: Not on file    Attends religious service: Not on file    Active member of club or  organization: Not on file    Attends meetings of clubs or organizations: Not on file    Relationship status: Not on file  Other Topics Concern  . Not on file  Social History Narrative  . Not on file     Family History: The patient's family history includes Colon cancer in her cousin; Colon polyps in her brother; Heart disease in her mother; Stomach cancer in her father. There is no history of Esophageal cancer, Rectal cancer, Pancreatic cancer, or Breast cancer.  ROS:   Please see the history of present illness.    Denies any fever chills diarrhea chest pain.  All other systems reviewed and are negative.  EKGs/Labs/Other Studies Reviewed:    The following studies were reviewed today: ECHO NUC ECG   ECHO 10/12/17: - Left ventricle: The cavity size was normal. Wall thickness was   normal. Systolic function was normal. The estimated ejection   fraction was in the range of 55% to 60%. Wall motion was normal;   there were no regional wall motion abnormalities. - Pulmonary arteries: Systolic pressure was mildly increased. PA   peak pressure: 39 mm Hg (S).  NUC 10/14/17:  The left ventricular ejection fraction is hyperdynamic (>65%).  Nuclear stress EF: 72%.  There was no ST segment deviation noted during stress.  This is a low risk study. No ischemia.   Donato Schultz, MD  EKG:  EKG is not ordered today.   Priors reviewd and NSR  Recent Labs: 10/11/2017: ALT 18; BUN 18; Creatinine, Ser 0.82; Hemoglobin 14.7; Platelets 253; Potassium 4.4; Sodium 140; TSH 4.360  Recent Lipid Panel    Component Value Date/Time   CHOL 160 10/11/2017 0850   TRIG 110 10/11/2017 0850   HDL 59 10/11/2017 0850   CHOLHDL 2.7 10/11/2017 0850   LDLCALC 79 10/11/2017 0850    Physical Exam:    VS:  BP 140/80   Pulse (!) 102   Ht 5\' 7"  (1.702 m)   Wt 151 lb 9.6 oz (68.8 kg)   SpO2 99%   BMI 23.74 kg/m     Wt Readings from Last 3 Encounters:  06/21/18 151 lb 9.6 oz (68.8 kg)  10/15/17 149 lb  12.8 oz (67.9 kg)  10/14/17 151 lb (68.5 kg)     GEN:  Well nourished, well developed in no acute distress HEENT: Normal NECK: No JVD; No carotid bruits LYMPHATICS: No lymphadenopathy CARDIAC: RRR, no murmurs, rubs, gallops RESPIRATORY:  Clear to auscultation without rales, wheezing or rhonchi  ABDOMEN: Soft, non-tender, non-distended MUSCULOSKELETAL:  No edema; No deformity  SKIN: Warm and dry NEUROLOGIC:  Alert and oriented x 3 PSYCHIATRIC:  Normal affect   ASSESSMENT:    1. Vaso vagal episode   2. Family history of early CAD   3. Essential hypertension    PLAN:    In order of problems listed above:  Near syncope - Sounds to me like she has some periodic autonomic dysfunction/vagal-like symptoms especially which can be associated with decrease in intravascular volume.  She does admit that she does not drink enough water or fluids throughout the day.  She has not had any high risk of red flag symptoms such as frank syncope.  Her flush-like sensation certainly could be a vagal type of reaction.  I would like to go ahead and treat her conservatively at this time with adequate hydration, support hose which she has used on airplane flights in the past.  Of course if things change or become more worrisome we can always consider monitoring her.  Resting heart rate is sometimes in the 90s on her Fitbit she may be prone to autonomic dysfunction.  Explained the physiology at length.  Essential hypertension -For now continue with the amlodipine and Toprol.  She is not taking HCTZ.  Even with the Toprol, resting heart rate can be in the 90s.  Sometimes she states that she thinks that she gets more excited when she takes the Toprol.  I asked her to maybe experiment with this as far as timing goes may be taking it at night to see how she reacts.  Preoperative risk stratification prior to knee surgery - She has had recent stress test that was low risk, no perfusion defects.  Able to complete  greater than 4 METS of activity.  She is quite active.  Echo with normal EF.  She may proceed with low risk from a cardiac perspective.  Prevention with strong family history of coronary artery disease -Congratulated her on her use of Crestor 20.  This is excellent.  Continue to monitor blood pressure.  Exercise daily.  Alcohol consumption in moderation.  We discussed the pros and cons of low-dose aspirin.  She says that she is a "free bleeder "and would rather not take the aspirin.  I think this is reasonable.   Medication Adjustments/Labs and Tests Ordered: Current medicines are reviewed at length with the patient today.  Concerns regarding medicines are outlined above.  No orders of the defined types were placed in this encounter.  No orders of the defined types were placed in this encounter.   Patient Instructions  Medication Instructions:  The current medical regimen is effective;  continue present plan and medications.  If you need a refill on your cardiac medications before your next appointment, please call your pharmacy.   Follow-Up: At [facility], you and your health needs are our priority.  As part of our continuing mission to provide you with exceptional heart care, we have created designated Provider Care Teams.  These Care Teams include your primary Cardiologist (physician) and Advanced Practice Providers (APPs -  Physician Assistants and Nurse Practitioners) who all work together to provide you with the care you need, when you need it. You will need a follow up appointment in 12 months.  Please call our office 2 months in advance to schedule this appointment.  You may see Donato SchultzMark Alyda Megna, MD or one of the following Advanced Practice Providers on your designated Care Team:   Norma FredricksonLori Gerhardt, NP Nada BoozerLaura Ingold, NP . Georgie ChardJill McDaniel, NP  Thank you  for choosing [facility]!!         Signed, Donato SchultzMark Quavion Boule, MD  06/21/2018 8:46 AM    [facility]

## 2018-07-11 DIAGNOSIS — Z6824 Body mass index (BMI) 24.0-24.9, adult: Secondary | ICD-10-CM | POA: Diagnosis not present

## 2018-07-11 DIAGNOSIS — D72829 Elevated white blood cell count, unspecified: Secondary | ICD-10-CM | POA: Diagnosis not present

## 2018-07-11 DIAGNOSIS — K5792 Diverticulitis of intestine, part unspecified, without perforation or abscess without bleeding: Secondary | ICD-10-CM | POA: Diagnosis not present

## 2018-07-11 DIAGNOSIS — R109 Unspecified abdominal pain: Secondary | ICD-10-CM | POA: Diagnosis not present

## 2018-07-12 DIAGNOSIS — E8809 Other disorders of plasma-protein metabolism, not elsewhere classified: Secondary | ICD-10-CM | POA: Diagnosis not present

## 2018-07-14 ENCOUNTER — Other Ambulatory Visit: Payer: Self-pay | Admitting: Internal Medicine

## 2018-07-14 ENCOUNTER — Ambulatory Visit
Admission: RE | Admit: 2018-07-14 | Discharge: 2018-07-14 | Disposition: A | Discharge status: Home / Self Care | Payer: Medicare Other | Source: Ambulatory Visit | Attending: Internal Medicine | Admitting: Internal Medicine

## 2018-07-14 DIAGNOSIS — K5792 Diverticulitis of intestine, part unspecified, without perforation or abscess without bleeding: Secondary | ICD-10-CM

## 2018-07-14 DIAGNOSIS — K760 Fatty (change of) liver, not elsewhere classified: Secondary | ICD-10-CM | POA: Diagnosis not present

## 2018-07-14 DIAGNOSIS — R109 Unspecified abdominal pain: Secondary | ICD-10-CM | POA: Diagnosis not present

## 2018-07-14 DIAGNOSIS — N390 Urinary tract infection, site not specified: Secondary | ICD-10-CM | POA: Diagnosis not present

## 2018-07-14 DIAGNOSIS — K5900 Constipation, unspecified: Secondary | ICD-10-CM

## 2018-07-14 DIAGNOSIS — D72829 Elevated white blood cell count, unspecified: Secondary | ICD-10-CM | POA: Diagnosis not present

## 2018-07-14 DIAGNOSIS — Z6824 Body mass index (BMI) 24.0-24.9, adult: Secondary | ICD-10-CM | POA: Diagnosis not present

## 2018-07-14 MED ORDER — IOPAMIDOL (ISOVUE-300) INJECTION 61%
100.0000 mL | Freq: Once | INTRAVENOUS | Status: AC | PRN
  Administered 2018-07-14: 100 mL via INTRAVENOUS

## 2018-07-26 DIAGNOSIS — D72829 Elevated white blood cell count, unspecified: Secondary | ICD-10-CM | POA: Diagnosis not present

## 2018-07-27 ENCOUNTER — Telehealth: Payer: Self-pay | Admitting: Internal Medicine

## 2018-07-27 DIAGNOSIS — R197 Diarrhea, unspecified: Secondary | ICD-10-CM | POA: Diagnosis not present

## 2018-07-27 NOTE — Telephone Encounter (Signed)
Pt has diverticulitis and reports symptoms of abd p and loose stool.  Pt had CT scan 2.20.20 - records in Epic.    Pt is scheduled for surgery for a torn meniscus 3.12.20 and would like Dr. Lamar Sprinkles advice on whether to proceed with the surgery whilst she is having flare up.

## 2018-07-28 NOTE — Telephone Encounter (Signed)
Pt states she was treated for diverticulitis and finished the antibiotics last week. Monday she started having pain again and was seen by PCP office, her WBC count was up again and she was told that she may have cdiff. Pt turned in sample for cdiff yesterday and is waiting to hear back from that result. Pt concerned and would like to see GI. Pt scheduled to see Hyacinth Meeker PA 08/03/18@9 :15am. Pt aware of appt and know she can call and cancel it if she gets results back and it is cdiff.

## 2018-07-29 DIAGNOSIS — E785 Hyperlipidemia, unspecified: Secondary | ICD-10-CM | POA: Diagnosis not present

## 2018-07-29 DIAGNOSIS — K5792 Diverticulitis of intestine, part unspecified, without perforation or abscess without bleeding: Secondary | ICD-10-CM | POA: Diagnosis not present

## 2018-07-29 DIAGNOSIS — I10 Essential (primary) hypertension: Secondary | ICD-10-CM | POA: Diagnosis not present

## 2018-07-29 DIAGNOSIS — D72829 Elevated white blood cell count, unspecified: Secondary | ICD-10-CM | POA: Diagnosis not present

## 2018-08-01 DIAGNOSIS — K5792 Diverticulitis of intestine, part unspecified, without perforation or abscess without bleeding: Secondary | ICD-10-CM | POA: Diagnosis not present

## 2018-08-03 ENCOUNTER — Encounter: Payer: Self-pay | Admitting: Physician Assistant

## 2018-08-03 ENCOUNTER — Other Ambulatory Visit: Payer: Self-pay

## 2018-08-03 ENCOUNTER — Ambulatory Visit: Payer: Medicare Other | Admitting: Physician Assistant

## 2018-08-03 VITALS — BP 146/6 | HR 60 | Ht 67.0 in | Wt 153.4 lb

## 2018-08-03 DIAGNOSIS — K5732 Diverticulitis of large intestine without perforation or abscess without bleeding: Secondary | ICD-10-CM

## 2018-08-03 NOTE — Progress Notes (Signed)
Chief Complaint: Recurrent diverticulitis  HPI:    Candice Richardson is a 73 year old female, known to Dr. Marina Goodell, with a past medical history as listed below, who was referred to me by Jarome Matin, MD for a complaint of recurrent diverticulitis.      12/16/2016 colonoscopy Dr. Marina Goodell with diverticulosis in the left and right colon, mild sigmoid stenosis, internal hemorrhoids and otherwise normal.  Repeat recommended in 5 years with a pediatric colonoscope.    07/14/2018 CT abdomen pelvis with contrast.  The exam was positive for mild appearing acute diverticulitis at the junction of the descending and sigmoid colon, negative for abscess or perforation.  Calcific aortic and coronary atherosclerosis and fatty infiltration of the liver.    08/01/2018 normal BMP and CBC.    Today, the patient explains that on 07/11/2018 she started with severe left lower quadrant pain and loose bowels around 4:00 in the morning.  Went to see her PCP and had a white count of around 17, she was given an antibiotic shot and started on Cipro and Flagyl.  Also had a CT showing diverticulitis.  Patient tells me that the next week 07/20/2018 she started to feel better and was doing okay until 06/27/2018 when she started again with "horrific" 10/10 left lower quadrant pain and loose stools again.  They thought she may have C. difficile and she was tested for this but was negative.  She was given another antibiotic shot even though her labs had normalized on 07/29/2018 and since then the patient has been fine with only minimal left lower quadrant abdominal pain "when I push in this area", and bowel movements that have returned to normal.  Currently she is on a low fiber diet and asking questions about what to do next.    Does have plans for knee surgery in the near future but is been having to put this off due to elevated white count off and on over the past month.    Denies fever, chills, weight loss, ataxia, nausea, vomiting, heartburn,  reflux or symptoms that awaken her from sleep.  Past Medical History:  Diagnosis Date  . Arthritis   . Diverticulitis   . GERD (gastroesophageal reflux disease)   . Heart murmur   . Hyperlipidemia   . Hypertension   . Torn meniscus     Past Surgical History:  Procedure Laterality Date  . APPENDECTOMY  1949   54 months old  . TOTAL KNEE ARTHROPLASTY Left 05/08/2016   Procedure: LEFT TOTAL KNEE ARTHROPLASTY;  Surgeon: Eugenia Mcalpine, MD;  Location: [facility];  Service: Orthopedics;  Laterality: Left;    Current Outpatient Medications  Medication Sig Dispense Refill  . amLODipine (NORVASC) 5 MG tablet Take 5 mg by mouth every evening.     . Calcium Carbonate-Vitamin D (CALTRATE 600+D PO) Take 1 tablet by mouth daily.     . Cholecalciferol (DIALYVITE VITAMIN D 5000 PO) Take 1 capsule by mouth daily.    . CO ENZYME Q-10 PO CoQ10 10 mg capsule  Take by oral route.    . metoprolol succinate (TOPROL-XL) 50 MG 24 hr tablet Take 50 mg by mouth daily. Take with or immediately following a meal.    . Multiple Vitamins-Minerals (MULTIVITAMIN WITH MINERALS) tablet Take 1 tablet by mouth daily.    Marland Kitchen omeprazole (PRILOSEC) 20 MG capsule Take 20 mg by mouth every evening.     Marland Kitchen PREMARIN vaginal cream Place 1 application vaginally every 14 (fourteen) days.     Marland Kitchen  rosuvastatin (CRESTOR) 20 MG tablet Take 20 mg by mouth daily.  2  . vitamin B-12 (CYANOCOBALAMIN) 500 MCG tablet Take 1,000 mcg by mouth daily.     . vitamin C (ASCORBIC ACID) 500 MG tablet Take 500 mg by mouth daily.     No current facility-administered medications for this visit.     Allergies as of 08/03/2018  . (No Known Allergies)    Family History  Problem Relation Age of Onset  . Heart disease Mother   . Stomach cancer Father   . Colon polyps Brother   . Colon cancer Cousin   . Esophageal cancer Neg Hx   . Rectal cancer Neg Hx   . Pancreatic cancer Neg Hx   . Breast cancer Neg Hx     Social History   Socioeconomic  History  . Marital status: Widowed    Spouse name: Not on file  . Number of children: Not on file  . Years of education: Not on file  . Highest education level: Not on file  Occupational History  . Not on file  Social Needs  . Financial resource strain: Not on file  . Food insecurity:    Worry: Not on file    Inability: Not on file  . Transportation needs:    Medical: Not on file    Non-medical: Not on file  Tobacco Use  . Smoking status: Never Smoker  . Smokeless tobacco: Never Used  Substance and Sexual Activity  . Alcohol use: Yes    Alcohol/week: 9.0 standard drinks    Types: 9 Glasses of wine per week  . Drug use: No  . Sexual activity: Not on file  Lifestyle  . Physical activity:    Days per week: Not on file    Minutes per session: Not on file  . Stress: Not on file  Relationships  . Social connections:    Talks on phone: Not on file    Gets together: Not on file    Attends religious service: Not on file    Active member of club or organization: Not on file    Attends meetings of clubs or organizations: Not on file    Relationship status: Not on file  . Intimate partner violence:    Fear of current or ex partner: Not on file    Emotionally abused: Not on file    Physically abused: Not on file    Forced sexual activity: Not on file  Other Topics Concern  . Not on file  Social History Narrative  . Not on file    Review of Systems:    Constitutional: No weight loss, fever or chills Cardiovascular: No chest pain Respiratory: No SOB  Gastrointestinal: See HPI and otherwise negative   Physical Exam:  Vital signs: BP (!) 146/6   Pulse 60   Ht 5\' 7"  (1.702 m)   Wt 153 lb 6.4 oz (69.6 kg)   BMI 24.03 kg/m    Constitutional:   Pleasant Caucasian female appears to be in NAD, Well developed, Well nourished, alert and cooperative  Respiratory: Respirations even and unlabored. Lungs clear to auscultation bilaterally.   No wheezes, crackles, or rhonchi.   Cardiovascular: Normal S1, S2. No MRG. Regular rate and rhythm. No peripheral edema, cyanosis or pallor.  Gastrointestinal:  Soft, nondistended, mild LLQ ttp. No rebound or guarding. Normal bowel sounds. No appreciable masses or hepatomegaly. Rectal:  Not performed.  Psychiatric: Demonstrates good judgement and reason without abnormal affect or behaviors.  See HPI for recent labs.  Assessment: 1.  Recurrent diverticulitis: 2 episodes within the past 3 weeks, cipro/flagylx 1 round, "abx shot x2", no symptoms currently- requesting PCP notes  Plan: 1.  At this time patient symptoms are gone/minimal.  She just has left lower quadrant TTP.  Recommend she continue on a low fiber/low residue diet for the next week and then gradually increase to high fiber when she no longer has left lower quadrant pain.  We discussed this in detail.  She was given a low fiber/low residue handout. 2.  Encouraged the patient pain to maintain regular stools.  In the future she will want to be on a high-fiber diet and drink plenty of water to try and avoid future incidences. 3.  Explained to patient that if she has an increase in left lower quadrant pain or return of symptoms within the next couple of months she should call our office.  At that time would recommend Augmentin. 4. Requested PCP notes/work up. 5.  Patient to follow in clinic with Dr. Marina Goodell or myself as needed in the near future.  She is not due for next surveillance colonoscopy until 2023.  Hyacinth Meeker, PA-C [facility] 08/03/2018, 9:05 AM  Cc: Jarome Matin, MD

## 2018-08-03 NOTE — Patient Instructions (Signed)
Normal BMI (Body Mass Index- based on height and weight) is between 23 and 30. Your BMI today is Body mass index is 24.03 kg/m. Marland Kitchen Please consider follow up  regarding your BMI with your Primary Care Provider.

## 2018-08-08 NOTE — Progress Notes (Signed)
Victorino Dike, assessment and plan reviewed.  In addition to antibiotics, I would consider repeat imaging should she have recurrent symptoms.  Thanks

## 2018-10-06 ENCOUNTER — Telehealth: Payer: Self-pay | Admitting: Internal Medicine

## 2018-10-06 ENCOUNTER — Other Ambulatory Visit: Payer: Self-pay

## 2018-10-06 ENCOUNTER — Ambulatory Visit (INDEPENDENT_AMBULATORY_CARE_PROVIDER_SITE_OTHER): Payer: Medicare Other | Admitting: Internal Medicine

## 2018-10-06 ENCOUNTER — Encounter: Payer: Self-pay | Admitting: Internal Medicine

## 2018-10-06 VITALS — Ht 66.0 in | Wt 153.0 lb

## 2018-10-06 DIAGNOSIS — Z8719 Personal history of other diseases of the digestive system: Secondary | ICD-10-CM

## 2018-10-06 DIAGNOSIS — R1084 Generalized abdominal pain: Secondary | ICD-10-CM | POA: Diagnosis not present

## 2018-10-06 DIAGNOSIS — R194 Change in bowel habit: Secondary | ICD-10-CM | POA: Diagnosis not present

## 2018-10-06 NOTE — Progress Notes (Signed)
HISTORY OF PRESENT ILLNESS:  Candice Richardson is a 73 y.o. female with a history of uncomplicated diverticulitis February 2020 who schedules this telehealth medicine visit in the midst of the coronavirus pandemic regarding problems with diarrhea, abdominal pain, and change in bowel habits.  She did undergo complete colonoscopy on multiple occasions in [place] and most recently here December 16, 2016 regarding a family history of colon cancer.  This most recent examination revealed multiple small and large mouth diverticula in the right and left colon with sigmoid stenosis.  No neoplasia.  She did develop acute diverticulitis which was confirmed by CT, without complications, February 2020 for which she was treated with 2 courses of ciprofloxacin and metronidazole.  A GI office appointment was arranged thereafter and occurred August 03, 2018 with the GI physician assistant.  At that time patient was deemed to be been getting better and no additional therapy is recommended.  Shortly thereafter she reported some transient discomfort for which he took 1 Augmentin prescribed by her PCP.  Thereafter felt better to and took no additional antibiotic.  Current history is that of change in bowel habits approximately 1 month ago.  She describes that her stools are more pebble-like and defecation is a bit more difficult.  She has taken Ex-Lax on one occasion 3 weeks ago which resulted in good bowel movements but cramping abdominal pain.  Yesterday she states that she developed lower abdominal pain followed by liquid stool and chills without fever.  Symptoms have resolved.  No pain now.  She wonders if this is diverticulitis and if she should initiate a course of antibiotic therapy.  No other active complaints.  CT: July 14, 2018: Mild appearing acute diverticulitis at the junction of the descending and sigmoid colon.  Negative for abscess or perforation.  Incidental fatty liver.  REVIEW OF SYSTEMS:  All non-GI ROS  negative unless otherwise stated in the HPI except for knee pain (meniscus tear anticipating repair)  Past Medical History:  Diagnosis Date  . Arthritis   . Diverticulitis   . GERD (gastroesophageal reflux disease)   . Heart murmur   . Hyperlipidemia   . Hypertension   . Torn meniscus     Past Surgical History:  Procedure Laterality Date  . APPENDECTOMY  1949   42 months old  . TOTAL KNEE ARTHROPLASTY Left 05/08/2016   Procedure: LEFT TOTAL KNEE ARTHROPLASTY;  Surgeon: Eugenia Mcalpine, MD;  Location: [facility];  Service: Orthopedics;  Laterality: Left;    Social History Emrys Skolnik  reports that she has never smoked. She has never used smokeless tobacco. She reports current alcohol use of about 9.0 standard drinks of alcohol per week. She reports that she does not use drugs.  family history includes Colon cancer in her cousin; Colon polyps in her brother; Heart disease in her mother; Stomach cancer in her father.  No Known Allergies     PHYSICAL EXAMINATION: No physical examination with telehealth visit   ASSESSMENT:  1.  Transient loose stools with abdominal discomfort and chills.  Now resolved.  Not clear that this is diverticulitis 2.  Documented uncomplicated acute diverticulitis February 2020.  Treated 3.  Change in bowel habits as described. 4.  Sigmoid stenosis on previous colonoscopy.  May be contributing to problems with her bowels 5.  Family history of colon cancer.  Last colonoscopy July 2018   PLAN:  1.  Recommended initiating MiraLAX or GlycoLax 1 scoop daily to improve bowel consistency 2.  Recommended holding off  on Augmentin therapy for now.  However, should she develop more persistent localized discomfort with or without fevers that initiate Augmentin 875 mg twice daily and contact this office.  She agrees 3.  For significant abdominal pain refractory to simple measures, go to the ER 4.  Contact this office for any questions or problems.  Depending upon  how she is doing, may need repeat imaging 5.  Routine follow-up screening colonoscopy around July 2023 This telehealth visit was scheduled by the patient and consented for by the patient was in her home while I was in my office.  She understands her may be an associated professional charge for this service

## 2018-10-06 NOTE — Telephone Encounter (Signed)
Dr. Marina Goodell please see mychart message below from pt:  Pt states she actually took one of the antibiotic pills last night.   Starting today I am wondering if my diverticulitis is back. I have had the chills, no fever, stomach pains, and bowel movements mostly liquid. On March 13 Dr. Eloise Harman called in an antibiotic for amoxicillin - clav 875-125 mg tab because I had terrible pain. He said to take it if I needed it. I did not take it and now I was wondering if I can start it. Also for the past two weeks I have been mostly constipated wondering if something was already going on. Anyway would like to start on this antibiotic before it elevates. I do not leave the house even to shop. Thank you !

## 2018-10-06 NOTE — Telephone Encounter (Signed)
Patient added for telehealth visit today at 11:30am. Pt aware of appt.

## 2018-10-06 NOTE — Patient Instructions (Signed)
If you are age 75 or older, your body mass index should be between 23-30. Your Body mass index is 24.69 kg/m. If this is out of the aforementioned range listed, please consider follow up with your Primary Care Provider.  If you are age 66 or younger, your body mass index should be between 19-25. Your Body mass index is 24.69 kg/m. If this is out of the aformentioned range listed, please consider follow up with your Primary Care Provider.    Recommended initiating MiraLAX or GlycoLax 1 scoop daily to improve bowel consistency\  Recommended holding off on Augmentin therapy for now.  However, should you develop more persistent localized discomfort with or without fevers that initiate Augmentin 875 mg twice daily and contact this office.    For significant abdominal pain refractory to simple measures, go to the ER.   Contact this office for any questions or problems. May need repeat imaging.   Routine follow-up screening colonoscopy around July 2023.  Thank you for choosing me and [facility].   Yancey Flemings, MD

## 2018-10-06 NOTE — Telephone Encounter (Signed)
Set up telehealth phone office visit with me today at 11:30 AM.  Thank you

## 2018-10-11 ENCOUNTER — Ambulatory Visit: Payer: Medicare Other | Admitting: Internal Medicine

## 2018-10-24 HISTORY — PX: KNEE ARTHROSCOPY W/ MENISCAL REPAIR: SHX1877

## 2018-11-17 DIAGNOSIS — M94261 Chondromalacia, right knee: Secondary | ICD-10-CM | POA: Diagnosis not present

## 2018-11-17 DIAGNOSIS — S83231A Complex tear of medial meniscus, current injury, right knee, initial encounter: Secondary | ICD-10-CM | POA: Diagnosis not present

## 2018-11-17 DIAGNOSIS — M2241 Chondromalacia patellae, right knee: Secondary | ICD-10-CM | POA: Diagnosis not present

## 2018-11-17 DIAGNOSIS — S83271A Complex tear of lateral meniscus, current injury, right knee, initial encounter: Secondary | ICD-10-CM | POA: Diagnosis not present

## 2018-11-22 ENCOUNTER — Telehealth: Payer: Self-pay | Admitting: Internal Medicine

## 2018-11-22 NOTE — Telephone Encounter (Signed)
Pt states she had knee surgery last Thursday and her last BM was last Wednesday. She took pain meds after the surgery and has not had a BM. She has taken miralax several days with no movement but only took one dose each day. Spoke with ortho and they advised an enema last night. Reports no movement with enema. Today she drank 1/2 bottle mag citrate and after an hour she only passed a little bit of soft brown stool. Instructed pt to finish the rest of the bottle. She will call back if she does not pass more stool.

## 2018-11-22 NOTE — Telephone Encounter (Signed)
Patient called in wanting to speak with the nurse. She stated that she is needing to discuss some issues that she is currently having and would like to explain to the nurse.

## 2018-11-23 DIAGNOSIS — M25661 Stiffness of right knee, not elsewhere classified: Secondary | ICD-10-CM | POA: Diagnosis not present

## 2018-11-24 DIAGNOSIS — I10 Essential (primary) hypertension: Secondary | ICD-10-CM | POA: Diagnosis not present

## 2018-11-24 DIAGNOSIS — E7849 Other hyperlipidemia: Secondary | ICD-10-CM | POA: Diagnosis not present

## 2018-11-28 DIAGNOSIS — R82998 Other abnormal findings in urine: Secondary | ICD-10-CM | POA: Diagnosis not present

## 2018-11-28 DIAGNOSIS — I10 Essential (primary) hypertension: Secondary | ICD-10-CM | POA: Diagnosis not present

## 2018-11-30 DIAGNOSIS — M25661 Stiffness of right knee, not elsewhere classified: Secondary | ICD-10-CM | POA: Diagnosis not present

## 2018-12-07 DIAGNOSIS — M25661 Stiffness of right knee, not elsewhere classified: Secondary | ICD-10-CM | POA: Diagnosis not present

## 2018-12-14 DIAGNOSIS — M25661 Stiffness of right knee, not elsewhere classified: Secondary | ICD-10-CM | POA: Diagnosis not present

## 2018-12-21 DIAGNOSIS — M25661 Stiffness of right knee, not elsewhere classified: Secondary | ICD-10-CM | POA: Diagnosis not present

## 2018-12-26 DIAGNOSIS — Z0120 Encounter for dental examination and cleaning without abnormal findings: Secondary | ICD-10-CM | POA: Diagnosis not present

## 2018-12-26 DIAGNOSIS — Z4789 Encounter for other orthopedic aftercare: Secondary | ICD-10-CM | POA: Diagnosis not present

## 2018-12-26 DIAGNOSIS — M1711 Unilateral primary osteoarthritis, right knee: Secondary | ICD-10-CM | POA: Diagnosis not present

## 2019-01-02 ENCOUNTER — Other Ambulatory Visit: Payer: Self-pay

## 2019-01-02 ENCOUNTER — Telehealth: Payer: Self-pay | Admitting: Internal Medicine

## 2019-01-02 MED ORDER — AMOXICILLIN-POT CLAVULANATE 875-125 MG PO TABS: 1.0000 | ORAL_TABLET | Freq: Two times a day (BID) | ORAL | 0 refills | Status: DC

## 2019-01-02 NOTE — Telephone Encounter (Signed)
Pt aware, script sent to pharmacy. Pt scheduled to see Dr. Henrene Pastor 01/27/19@2 :40pm. Pt knows to call back if no improvement.

## 2019-01-02 NOTE — Telephone Encounter (Signed)
Pt reported having a diverticulitis flare up and requested a call to discuss.

## 2019-01-02 NOTE — Telephone Encounter (Signed)
Candice Richardson pt with history of diverticulitis. Reports she started having some discomfort in her abdomen 2 days ago but last night she had bad abd pain in her LLQ along with nausea and chills. Reports tenderness in her LLQ. Pt requesting antibiotics. As DOD please advise.

## 2019-01-02 NOTE — Telephone Encounter (Signed)
DOD  I have reviewed Candice Richardson's last note. Looks like she got Cipro and Flagyl before.  Plan was to give her Augmentin if she still had problems.   Plan: -Augmentin 875 mg twice daily x 10 days. NR.  I think she will improve. -If there is no improvement in 48-72 hrs, get CBC, CMP and proceed with CT Abdo/pelvis with p.o. and IV contrast. -Low fiber diet.  Once done with antibiotics, start high-fiber diet. -FU in 4 weeks with Dr Henrene Pastor or APP clinic.   RG

## 2019-01-19 ENCOUNTER — Other Ambulatory Visit: Payer: Self-pay | Admitting: Obstetrics and Gynecology

## 2019-01-19 DIAGNOSIS — Z1231 Encounter for screening mammogram for malignant neoplasm of breast: Secondary | ICD-10-CM

## 2019-01-27 ENCOUNTER — Ambulatory Visit (INDEPENDENT_AMBULATORY_CARE_PROVIDER_SITE_OTHER): Payer: Medicare Other | Admitting: Internal Medicine

## 2019-01-27 ENCOUNTER — Encounter: Payer: Self-pay | Admitting: Internal Medicine

## 2019-01-27 VITALS — BP 130/68 | HR 76 | Temp 98.3°F | Ht 65.5 in | Wt 150.5 lb

## 2019-01-27 DIAGNOSIS — K5732 Diverticulitis of large intestine without perforation or abscess without bleeding: Secondary | ICD-10-CM | POA: Diagnosis not present

## 2019-01-27 DIAGNOSIS — R194 Change in bowel habit: Secondary | ICD-10-CM | POA: Diagnosis not present

## 2019-01-27 MED ORDER — AMOXICILLIN-POT CLAVULANATE 875-125 MG PO TABS: 1.0000 | ORAL_TABLET | Freq: Two times a day (BID) | ORAL | 0 refills | Status: DC

## 2019-01-27 NOTE — Patient Instructions (Signed)
We have sent the following medications to your pharmacy for you to pick up at your convenience: Augmentin  Begin taking 2 tablespoons of Metamucil daily

## 2019-01-27 NOTE — Progress Notes (Signed)
HISTORY OF PRESENT ILLNESS:  Candice Richardson is a 73 y.o. female with radiographically documented diverticulitis who presents today for follow-up after a probable diverticular flare.  I last saw the patient Oct 06, 2018 via telehealth medicine for complaints of transient loose stools with abdominal discomfort and chills.  See that dictation for details.  Patient last underwent colonoscopy July 2018 and was noted to have diverticulosis with sigmoid stenosis.  She does have family history of colon cancer.  She did have some constipation after orthopedic surgery in June for which she contacted the office.  That resolved.  In early August she contacted the office with left lower quadrant pain reminiscent of her prior episode of diverticulitis.  She was placed on Augmentin 875 mg twice daily for 10 days.  Her pain resolved.  This follow-up appointment made.  The patient has done well since.  No recurrence of pain.  She does describe her bowel habits as pellet like.  She has been avoiding nuts and seeds.  For GERD she takes Prilosec.  No other issues.  REVIEW OF SYSTEMS:  All non-GI ROS negative unless otherwise stated in the HPI except for arthritis  Past Medical History:  Diagnosis Date  . Arthritis   . Diverticulitis   . GERD (gastroesophageal reflux disease)   . Heart murmur   . Hyperlipidemia   . Hypertension   . Torn meniscus     Past Surgical History:  Procedure Laterality Date  . APPENDECTOMY  1949   18 months old  . KNEE ARTHROSCOPY W/ MENISCAL REPAIR Right 10/2018  . TOTAL KNEE ARTHROPLASTY Left 05/08/2016   Procedure: LEFT TOTAL KNEE ARTHROPLASTY;  Surgeon: Sydnee Cabal, MD;  Location: [facility];  Service: Orthopedics;  Laterality: Left;    Social History Roxanna Mcever  reports that she has never smoked. She has never used smokeless tobacco. She reports current alcohol use of about 9.0 standard drinks of alcohol per week. She reports that she does not use drugs.  family history  includes Colon cancer in her cousin; Colon polyps in her brother; Heart disease in her mother; Stomach cancer in her father.  No Known Allergies     PHYSICAL EXAMINATION: Vital signs: BP 130/68 (BP Location: Left Arm, Patient Position: Sitting, Cuff Size: Normal)   Pulse 76   Temp 98.3 F (36.8 C)   Ht 5' 5.5" (1.664 m) Comment: height measured without shoes  Wt 150 lb 8 oz (68.3 kg)   BMI 24.66 kg/m   Constitutional: generally well-appearing, no acute distress Psychiatric: alert and oriented x3, cooperative Eyes: extraocular movements intact, anicteric, conjunctiva pink Mouth: oral pharynx moist, no lesions Neck: supple no lymphadenopathy Cardiovascular: heart regular rate and rhythm, no murmur Lungs: clear to auscultation bilaterally Abdomen: soft, nontender, nondistended, no obvious ascites, no peritoneal signs, normal bowel sounds, no organomegaly Rectal: Omitted Extremities: no clubbing, cyanosis, or lower extremity edema bilaterally Skin: no lesions on visible extremities Neuro: No focal deficits.  Cranial nerves intact  ASSESSMENT:  1.  Recurrent sigmoid diverticulitis.  Improved after course of Augmentin.  Irregular bowel habits. 2.  Colonoscopy 2018 with diverticulosis and sigmoid stenosis 3.  Family history of colon cancer 4.  GERD.  Asymptomatic on PPI   PLAN:  1.  Recommended Metamucil 1 to 2 tablespoons daily to improve bowel consistency 2.  Told her that there is no evidence that avoiding nuts and seeds will reduce the risk of diverticular flare 3.  Given a prescription for Augmentin 875 mg; #20; take 1  twice daily as needed.  This, should she have a bout of diverticulitis do not have access to medical attention (for example, on vacation).  She has agreed to contact the office should she initiate therapy for which she believes is a diverticular flare.  She also understands for severe pain to go to the emergency room regardless. 4.  Surveillance colonoscopy  around 2023.  Interval follow-up as needed 25-minute spent face-to-face with the patient.  Greater than 50% of the time used for counseling regarding her recurrent diverticulitis and bowel habits.

## 2019-02-09 DIAGNOSIS — Z23 Encounter for immunization: Secondary | ICD-10-CM | POA: Diagnosis not present

## 2019-03-29 ENCOUNTER — Ambulatory Visit
Admission: RE | Admit: 2019-03-29 | Discharge: 2019-03-29 | Disposition: A | Discharge status: Home / Self Care | Payer: Medicare Other | Source: Ambulatory Visit | Attending: Obstetrics and Gynecology | Admitting: Obstetrics and Gynecology

## 2019-03-29 ENCOUNTER — Other Ambulatory Visit: Payer: Self-pay

## 2019-03-29 DIAGNOSIS — M1711 Unilateral primary osteoarthritis, right knee: Secondary | ICD-10-CM | POA: Diagnosis not present

## 2019-03-29 DIAGNOSIS — Z1231 Encounter for screening mammogram for malignant neoplasm of breast: Secondary | ICD-10-CM | POA: Diagnosis not present

## 2019-03-31 DIAGNOSIS — R05 Cough: Secondary | ICD-10-CM | POA: Diagnosis not present

## 2019-03-31 DIAGNOSIS — Z20818 Contact with and (suspected) exposure to other bacterial communicable diseases: Secondary | ICD-10-CM | POA: Diagnosis not present

## 2019-03-31 DIAGNOSIS — J069 Acute upper respiratory infection, unspecified: Secondary | ICD-10-CM | POA: Diagnosis not present

## 2019-03-31 DIAGNOSIS — Z20828 Contact with and (suspected) exposure to other viral communicable diseases: Secondary | ICD-10-CM | POA: Diagnosis not present

## 2019-04-12 DIAGNOSIS — Z01818 Encounter for other preprocedural examination: Secondary | ICD-10-CM | POA: Diagnosis not present

## 2019-04-12 DIAGNOSIS — E785 Hyperlipidemia, unspecified: Secondary | ICD-10-CM | POA: Diagnosis not present

## 2019-04-12 DIAGNOSIS — M25561 Pain in right knee: Secondary | ICD-10-CM | POA: Diagnosis not present

## 2019-04-12 DIAGNOSIS — I10 Essential (primary) hypertension: Secondary | ICD-10-CM | POA: Diagnosis not present

## 2019-04-26 DIAGNOSIS — Z20828 Contact with and (suspected) exposure to other viral communicable diseases: Secondary | ICD-10-CM | POA: Diagnosis not present

## 2019-05-08 NOTE — Progress Notes (Signed)
Can you please place the order for this pt. ,she is coming for her PST appointment on 05/09/2019

## 2019-05-08 NOTE — Patient Instructions (Addendum)
DUE TO COVID-19 ONLY ONE VISITOR IS ALLOWED TO COME WITH YOU AND STAY IN THE WAITING ROOM ONLY DURING PRE OP AND PROCEDURE DAY OF SURGERY. THE 1 VISITOR MAY VISIT WITH YOU AFTER SURGERY IN YOUR PRIVATE ROOM DURING VISITING HOURS ONLY!  YOU NEED TO HAVE A COVID 19 TEST ON: 05/09/2019 @    11:30 AM THIS TEST MUST BE DONE BEFORE SURGERY, COME  [address], [place] , [address].  ([place]) ONCE YOUR COVID TEST IS COMPLETED, PLEASE BEGIN THE QUARANTINE INSTRUCTIONS AS OUTLINED IN YOUR HANDOUT.                Candice Richardson    Your procedure is scheduled on: 05/12/2019   Report to [facility] Main  Entrance    Report to admitting at: 5:30 AM     Call this number if you have problems the morning of surgery [phone]    NO SOLID FOOD AFTER MIDNIGHT THE NIGHT PRIOR TO SURGERY. NOTHING BY MOUTH EXCEPT CLEAR LIQUIDS UNTIL: 4:30 AM. PLEASE FINISH ENSURE DRINK PER SURGEON ORDER  WHICH NEEDS TO BE COMPLETED AT : 4:30   CLEAR LIQUID DIET   Foods Allowed                                                                     Foods Excluded  Coffee and tea, regular and decaf                             liquids that you cannot  Plain Jell-O any favor except red or purple                                           see through such as: Fruit ices (not with fruit pulp)                                     milk, soups, orange juice  Iced Popsicles                                    All solid food Carbonated beverages, regular and diet                                    Cranberry, grape and apple juices Sports drinks like Gatorade Lightly seasoned clear broth or consume(fat free) Sugar, honey syrup  Sample Menu Breakfast                                Lunch                                     Supper Cranberry juice  Beef broth                            Chicken broth Jell-O                                     Grape juice                           Apple  juice Coffee or tea                        Jell-O                                      Popsicle                                                Coffee or tea                        Coffee or tea  _____________________________________________________________________        BRUSH YOUR TEETH MORNING OF SURGERY AND RINSE YOUR MOUTH OUT, NO CHEWING GUM CANDY OR MINTS.     Take these medicines the morning of surgery with A SIP OF WATER: Metoprolol,Crestor                                 You may not have any metal on your body including hair pins and              piercings  Do not wear jewelry, make-up, lotions, powders or perfumes, deodorant             Do not wear nail polish on your fingernails.  Do not shave  48 hours prior to surgery.              Men may shave face and neck.   Do not bring valuables to the hospital. [facility] IS NOT             RESPONSIBLE   FOR VALUABLES.  Contacts, dentures or bridgework may not be worn into surgery.  Leave suitcase in the car. After surgery it may be brought to your room.     Patients discharged the day of surgery will not be allowed to drive home. IF YOU ARE HAVING SURGERY AND GOING HOME THE SAME DAY, YOU MUST HAVE AN ADULT TO DRIVE YOU HOME AND  BE WITH YOU FOR 24 HOURS. YOU MAY GO HOME BY TAXI OR UBER OR ORTHERWISE, BUT AN ADULT MUST ACCOMPANY YOU HOME AND STAY WITH YOU FOR 24 HOURS.  Name and phone number of your driver:  Special Instructions: N/A              Please read over the following fact sheets you were given: _____________________________________________________________________             [facility] : [facility] Pre[facility] Before surgery, you can play an important role.  Because skin is not sterile, your skin needs to be  as free of germs as possible.  You can reduce the number of germs on your skin by washing with CHG (chlorahexidine gluconate) soap before surgery.  CHG is an antiseptic cleaner which kills germs and bonds  with the skin to continue killing germs even after washing. Please DO NOT use if you have an allergy to CHG or antibacterial soaps.  If your skin becomes reddened/irritated stop using the CHG and inform your nurse when you arrive at Short Stay. Do not shave (including legs and underarms) for at least 48 hours prior to the first CHG shower.  You may shave your face/neck. Please follow these instructions carefully:  1.  Shower with CHG Soap the night before surgery and the  morning of Surgery.  2.  If you choose to wash your hair, wash your hair first as usual with your  normal  shampoo.  3.  After you shampoo, rinse your hair and body thoroughly to remove the  shampoo.                           4.  Use CHG as you would any other liquid soap.  You can apply chg directly  to the skin and wash                       Gently with a scrungie or clean washcloth.  5.  Apply the CHG Soap to your body ONLY FROM THE NECK DOWN.   Do not use on face/ open                           Wound or open sores. Avoid contact with eyes, ears mouth and genitals (private parts).                       Wash face,  Genitals (private parts) with your normal soap.             6.  Wash thoroughly, paying special attention to the area where your surgery  will be performed.  7.  Thoroughly rinse your body with warm water from the neck down.  8.  DO NOT shower/wash with your normal soap after using and rinsing off  the CHG Soap.                9.  Pat yourself dry with a clean towel.            10.  Wear clean pajamas.            11.  Place clean sheets on your bed the night of your first shower and do not  sleep with pets. Day of Surgery : Do not apply any lotions/deodorants the morning of surgery.  Please wear clean clothes to the hospital/surgery center.  FAILURE TO FOLLOW THESE INSTRUCTIONS MAY RESULT IN THE CANCELLATION OF YOUR SURGERY PATIENT SIGNATURE_________________________________  NURSE  SIGNATURE__________________________________  ________________________________________________________________________

## 2019-05-08 NOTE — H&P (Signed)
TOTAL KNEE ADMISSION H&P  Patient is being admitted for right total knee arthroplasty.  Subjective:  Chief Complaint:right knee pain.  HPI: Para Candice Richardson, 73 y.o. female, has a history of pain and functional disability in the right knee due to arthritis and has failed non-surgical conservative treatments for greater than 12 weeks to includeNSAID's and/or analgesics, corticosteriod injections, viscosupplementation injections, flexibility and strengthening excercises and supervised PT with diminished ADL's post treatment.  Onset of symptoms was gradual, starting 3 years ago with gradually worsening course since that time. The patient noted prior procedures on the knee to include  arthroscopy and menisectomy on the right knee(s).  Patient currently rates pain in the right knee(s) at 5 out of 10 with activity. Patient has worsening of pain with activity and weight bearing, pain that interferes with activities of daily living, pain with passive range of motion and crepitus.  Patient has evidence of periarticular osteophytes and joint space narrowing by imaging studies. This patient has had no previous injury. There is no active infection.  Patient Active Problem List   Diagnosis Date Noted  . Total knee replacement status, left 05/08/2016  . Mild mitral regurgitation 12/17/2013  . Atypical chest pain 10/05/2013  . GERD (gastroesophageal reflux disease) 10/05/2013  . HTN (hypertension) 10/05/2013  . Hyperlipidemia 10/05/2013   Past Medical History:  Diagnosis Date  . Arthritis   . Diverticulitis   . GERD (gastroesophageal reflux disease)   . Heart murmur   . Hyperlipidemia   . Hypertension   . Torn meniscus     Past Surgical History:  Procedure Laterality Date  . APPENDECTOMY  1947   31 months old  . KNEE ARTHROSCOPY W/ MENISCAL REPAIR Right 10/2018  . TOTAL KNEE ARTHROPLASTY Left 05/08/2016   Procedure: LEFT TOTAL KNEE ARTHROPLASTY;  Surgeon: Eugenia Mcalpine, MD;  Location: [facility] ORS;   Service: Orthopedics;  Laterality: Left;    No current facility-administered medications for this encounter.   Current Outpatient Medications  Medication Sig Dispense Refill Last Dose  . amLODipine (NORVASC) 5 MG tablet Take 5 mg by mouth every evening.      . Ascorbic Acid (VITAMIN C) 1000 MG tablet Take 1,000 mg by mouth daily.      . Calcium Carbonate-Vitamin D (CALTRATE 600+D PO) Take 1 tablet by mouth daily.      . Cholecalciferol (DIALYVITE VITAMIN D 5000 PO) Take 5,000 Units by mouth daily.      . Coenzyme Q10 (CO Q-10) 100 MG CAPS Take 100 mg by mouth daily.     Marland Kitchen estradiol (ESTRACE) 0.1 MG/GM vaginal cream Place 1 Applicatorful vaginally once a week.     . Melatonin 3 MG TABS Take 3 mg by mouth daily.     . metoprolol succinate (TOPROL-XL) 50 MG 24 hr tablet Take 50 mg by mouth daily. Take with or immediately following a meal.     . Multiple Vitamins-Minerals (MULTIVITAMIN WITH MINERALS) tablet Take 1 tablet by mouth daily.     Marland Kitchen omeprazole (PRILOSEC) 20 MG capsule Take 20 mg by mouth every evening.      . rosuvastatin (CRESTOR) 20 MG tablet Take 20 mg by mouth daily.  2   . vitamin B-12 (CYANOCOBALAMIN) 1000 MCG tablet Take 1,000 mcg by mouth daily.      Marland Kitchen amoxicillin-clavulanate (AUGMENTIN) 875-125 MG tablet Take 1 tablet by mouth 2 (two) times daily. (Patient not taking: Reported on 05/03/2019) 20 tablet 0 Not Taking at Unknown time   No Known Allergies  Social History   Tobacco Use  . Smoking status: Never Smoker  . Smokeless tobacco: Never Used  Substance Use Topics  . Alcohol use: Yes    Alcohol/week: 9.0 standard drinks    Types: 9 Glasses of wine per week    Family History  Problem Relation Age of Onset  . Heart disease Mother   . Stomach cancer Father   . Colon polyps Brother   . Colon cancer Cousin   . Esophageal cancer Neg Hx   . Rectal cancer Neg Hx   . Pancreatic cancer Neg Hx   . Breast cancer Neg Hx      Review of Systems  Constitutional: Negative.    HENT: Negative.   Eyes: Negative.   Respiratory: Negative.   Cardiovascular: Negative.   Gastrointestinal: Negative.   Endocrine: Negative.   Genitourinary: Negative.   Musculoskeletal: Positive for arthralgias, gait problem and joint swelling.  Skin: Negative.   Allergic/Immunologic: Negative.   Hematological: Negative.   Psychiatric/Behavioral: Negative.   All other systems reviewed and are negative.   Objective:  Physical Exam  Constitutional: She is oriented to person, place, and time. She appears well-developed and well-nourished.  HENT:  Head: Normocephalic and atraumatic.  Neck: No JVD present.  Cardiovascular: Normal rate, regular rhythm and normal heart sounds.  Respiratory: Effort normal and breath sounds normal. No stridor.  GI: There is no abdominal tenderness. There is no guarding.  Musculoskeletal:     Cervical back: Normal range of motion and neck supple.     Comments: Tenderness over medial joint line Pain with ROM of right knee  Neurological: She is alert and oriented to person, place, and time.  Skin: Skin is warm and dry.  Psychiatric: She has a normal mood and affect. Her behavior is normal. Judgment and thought content normal.    Vital signs in last 24 hours: BP: ()/()  Arterial Line BP: ()/()   Labs:   Estimated body mass index is 24.66 kg/m as calculated from the following:   Height as of 01/27/19: 5' 5.5" (1.664 m).   Weight as of 01/27/19: 68.3 kg.   Imaging Review Plain radiographs demonstrate severe degenerative joint disease of the right knee(s). The overall alignment issignificant varus. The bone quality appears to be good for age and reported activity level.      Assessment/Plan:  End stage arthritis, right knee   The patient history, physical examination, clinical judgment of the provider and imaging studies are consistent with end stage degenerative joint disease of the right knee(s) and total knee arthroplasty is deemed  medically necessary. The treatment options including medical management, injection therapy arthroscopy and arthroplasty were discussed at length. The risks and benefits of total knee arthroplasty were presented and reviewed. The risks due to aseptic loosening, infection, stiffness, patella tracking problems, thromboembolic complications and other imponderables were discussed. The patient acknowledged the explanation, agreed to proceed with the plan and consent was signed. Patient is being admitted for inpatient treatment for surgery, pain control, PT, OT, prophylactic antibiotics, VTE prophylaxis, progressive ambulation and ADL's and discharge planning. The patient is planning to be discharged home with home health services. Patient would like to start with Outpatient physical therapy after surgery.

## 2019-05-08 NOTE — H&P (View-Only) (Signed)
Can you please place the order for this pt. ,she is coming for her PST appointment on 05/09/2019 

## 2019-05-09 ENCOUNTER — Encounter (HOSPITAL_COMMUNITY): Payer: Self-pay

## 2019-05-09 ENCOUNTER — Other Ambulatory Visit: Payer: Self-pay

## 2019-05-09 ENCOUNTER — Encounter (HOSPITAL_COMMUNITY)
Admission: RE | Admit: 2019-05-09 | Discharge: 2019-05-09 | Disposition: A | Discharge status: Home / Self Care | Payer: Medicare Other | Source: Ambulatory Visit | Attending: Specialist | Admitting: Specialist

## 2019-05-09 ENCOUNTER — Other Ambulatory Visit (HOSPITAL_COMMUNITY)
Admission: RE | Admit: 2019-05-09 | Discharge: 2019-05-09 | Disposition: A | Discharge status: Home / Self Care | Payer: Medicare Other | Source: Ambulatory Visit | Attending: Specialist | Admitting: Specialist

## 2019-05-09 DIAGNOSIS — Z20828 Contact with and (suspected) exposure to other viral communicable diseases: Secondary | ICD-10-CM | POA: Diagnosis not present

## 2019-05-09 DIAGNOSIS — Z8249 Family history of ischemic heart disease and other diseases of the circulatory system: Secondary | ICD-10-CM | POA: Diagnosis not present

## 2019-05-09 DIAGNOSIS — G8918 Other acute postprocedural pain: Secondary | ICD-10-CM | POA: Diagnosis not present

## 2019-05-09 DIAGNOSIS — I340 Nonrheumatic mitral (valve) insufficiency: Secondary | ICD-10-CM | POA: Diagnosis not present

## 2019-05-09 DIAGNOSIS — Z800 Family history of malignant neoplasm of digestive organs: Secondary | ICD-10-CM | POA: Diagnosis not present

## 2019-05-09 DIAGNOSIS — M1711 Unilateral primary osteoarthritis, right knee: Secondary | ICD-10-CM | POA: Diagnosis not present

## 2019-05-09 DIAGNOSIS — Z8371 Family history of colonic polyps: Secondary | ICD-10-CM | POA: Diagnosis not present

## 2019-05-09 DIAGNOSIS — E785 Hyperlipidemia, unspecified: Secondary | ICD-10-CM | POA: Diagnosis not present

## 2019-05-09 DIAGNOSIS — I10 Essential (primary) hypertension: Secondary | ICD-10-CM | POA: Diagnosis not present

## 2019-05-09 DIAGNOSIS — K219 Gastro-esophageal reflux disease without esophagitis: Secondary | ICD-10-CM | POA: Diagnosis not present

## 2019-05-09 DIAGNOSIS — Z01812 Encounter for preprocedural laboratory examination: Secondary | ICD-10-CM | POA: Insufficient documentation

## 2019-05-09 LAB — CBC
HCT: 43.8 % (ref 36.0–46.0)
Hemoglobin: 14.5 g/dL (ref 12.0–15.0)
MCH: 31.7 pg (ref 26.0–34.0)
MCHC: 33.1 g/dL (ref 30.0–36.0)
MCV: 95.6 fL (ref 80.0–100.0)
Platelets: 281 10*3/uL (ref 150–400)
RBC: 4.58 MIL/uL (ref 3.87–5.11)
RDW: 12.4 % (ref 11.5–15.5)
WBC: 7.3 10*3/uL (ref 4.0–10.5)
nRBC: 0 % (ref 0.0–0.2)

## 2019-05-09 LAB — BASIC METABOLIC PANEL
Anion gap: 9 (ref 5–15)
BUN: 16 mg/dL (ref 8–23)
CO2: 28 mmol/L (ref 22–32)
Calcium: 9.7 mg/dL (ref 8.9–10.3)
Chloride: 105 mmol/L (ref 98–111)
Creatinine, Ser: 0.71 mg/dL (ref 0.44–1.00)
GFR calc Af Amer: >60 mL/min (ref >60)
GFR calc non Af Amer: >60 mL/min (ref >60)
Glucose, Bld: 102 mg/dL — ABNORMAL HIGH (ref 70–99)
Potassium: 5 mmol/L (ref 3.5–5.1)
Sodium: 142 mmol/L (ref 135–145)

## 2019-05-09 LAB — SURGICAL PCR SCREEN
MRSA, PCR: NEGATIVE
Staphylococcus aureus: NEGATIVE

## 2019-05-09 NOTE — Progress Notes (Signed)
PCP - Dr. Leanna Battles Cardiologist -   Chest x-ray - 03/31/2019 on Chart for [facility] EKG - 04/12/2019 on Chart for [facility] Stress Test -  ECHO -  Cardiac Cath -   Sleep Study -  CPAP -   Fasting Blood Sugar -  Checks Blood Sugar _____ times a day  Blood Thinner Instructions: Aspirin Instructions: Last Dose:  Anesthesia review:   Patient denies shortness of breath, fever, cough and chest pain at PAT appointment   Patient verbalized understanding of instructions that were given to them at the PAT appointment. Patient was also instructed that they will need to review over the PAT instructions again at home before surgery.

## 2019-05-10 LAB — NOVEL CORONAVIRUS, NAA (HOSP ORDER, SEND-OUT TO REF LAB; TAT 18-24 HRS): SARS-CoV-2, NAA: NOT DETECTED

## 2019-05-11 ENCOUNTER — Encounter (HOSPITAL_COMMUNITY): Payer: Self-pay | Admitting: Specialist

## 2019-05-12 ENCOUNTER — Inpatient Hospital Stay (HOSPITAL_COMMUNITY): Payer: Medicare Other | Admitting: Anesthesiology

## 2019-05-12 ENCOUNTER — Encounter (HOSPITAL_COMMUNITY)
Admission: RE | Disposition: A | Discharge status: Home / Self Care | Payer: Self-pay | Source: Home / Self Care | Attending: Specialist

## 2019-05-12 ENCOUNTER — Encounter (HOSPITAL_COMMUNITY): Payer: Self-pay | Admitting: Specialist

## 2019-05-12 ENCOUNTER — Other Ambulatory Visit: Payer: Self-pay

## 2019-05-12 ENCOUNTER — Inpatient Hospital Stay (HOSPITAL_COMMUNITY): Payer: Medicare Other | Admitting: Physician Assistant

## 2019-05-12 ENCOUNTER — Inpatient Hospital Stay (HOSPITAL_COMMUNITY)
Admission: RE | Admit: 2019-05-12 | Discharge: 2019-05-13 | DRG: 470 | Disposition: A | Discharge status: Home Health | Payer: Medicare Other | Attending: Specialist | Admitting: Specialist

## 2019-05-12 DIAGNOSIS — E785 Hyperlipidemia, unspecified: Secondary | ICD-10-CM | POA: Diagnosis present

## 2019-05-12 DIAGNOSIS — Z800 Family history of malignant neoplasm of digestive organs: Secondary | ICD-10-CM

## 2019-05-12 DIAGNOSIS — M1711 Unilateral primary osteoarthritis, right knee: Secondary | ICD-10-CM | POA: Diagnosis present

## 2019-05-12 DIAGNOSIS — Z20828 Contact with and (suspected) exposure to other viral communicable diseases: Secondary | ICD-10-CM | POA: Diagnosis present

## 2019-05-12 DIAGNOSIS — Z8371 Family history of colonic polyps: Secondary | ICD-10-CM

## 2019-05-12 DIAGNOSIS — Z8249 Family history of ischemic heart disease and other diseases of the circulatory system: Secondary | ICD-10-CM | POA: Diagnosis not present

## 2019-05-12 DIAGNOSIS — K219 Gastro-esophageal reflux disease without esophagitis: Secondary | ICD-10-CM | POA: Diagnosis present

## 2019-05-12 DIAGNOSIS — I10 Essential (primary) hypertension: Secondary | ICD-10-CM | POA: Diagnosis present

## 2019-05-12 HISTORY — PX: TOTAL KNEE ARTHROPLASTY: SHX125

## 2019-05-12 SURGERY — ARTHROPLASTY, KNEE, TOTAL
Anesthesia: Monitor Anesthesia Care | Site: Knee | Laterality: Right

## 2019-05-12 MED ORDER — MEPIVACAINE HCL (PF) 2 % IJ SOLN
INTRAMUSCULAR | Status: AC
  Filled 2019-05-12: qty 20

## 2019-05-12 MED ORDER — DIPHENHYDRAMINE HCL 12.5 MG/5ML PO ELIX: 12.5000 mg | ORAL_SOLUTION | ORAL | Status: DC | PRN

## 2019-05-12 MED ORDER — METHOCARBAMOL 500 MG PO TABS
500.0000 mg | ORAL_TABLET | Freq: Four times a day (QID) | ORAL | Status: DC | PRN
  Administered 2019-05-12 (×2): 500 mg via ORAL
  Filled 2019-05-12 (×2): qty 1

## 2019-05-12 MED ORDER — BUPIVACAINE IN DEXTROSE 0.75-8.25 % IT SOLN
INTRATHECAL | Status: DC | PRN
  Administered 2019-05-12: 2 mL via INTRATHECAL

## 2019-05-12 MED ORDER — MAGNESIUM CITRATE PO SOLN: 1.0000 | Freq: Once | ORAL | Status: DC | PRN

## 2019-05-12 MED ORDER — GLYCOPYRROLATE PF 0.2 MG/ML IJ SOSY
PREFILLED_SYRINGE | INTRAMUSCULAR | Status: AC
  Filled 2019-05-12: qty 1

## 2019-05-12 MED ORDER — 0.9 % SODIUM CHLORIDE (POUR BTL) OPTIME
TOPICAL | Status: DC | PRN
  Administered 2019-05-12: 08:00:00 1000 mL

## 2019-05-12 MED ORDER — SODIUM CHLORIDE 0.9 % IV SOLN: INTRAVENOUS | Status: DC

## 2019-05-12 MED ORDER — CHLORHEXIDINE GLUCONATE 4 % EX LIQD: 60.0000 mL | Freq: Once | CUTANEOUS | Status: DC

## 2019-05-12 MED ORDER — TRAMADOL HCL 50 MG PO TABS
50.0000 mg | ORAL_TABLET | Freq: Four times a day (QID) | ORAL | Status: DC
  Administered 2019-05-12 – 2019-05-13 (×5): 50 mg via ORAL
  Filled 2019-05-12 (×5): qty 1

## 2019-05-12 MED ORDER — ONDANSETRON HCL 4 MG PO TABS: 4.0000 mg | ORAL_TABLET | Freq: Every day | ORAL | 1 refills | Status: DC | PRN

## 2019-05-12 MED ORDER — LIDOCAINE 2% (20 MG/ML) 5 ML SYRINGE
INTRAMUSCULAR | Status: AC
  Filled 2019-05-12: qty 5

## 2019-05-12 MED ORDER — ONDANSETRON HCL 4 MG/2ML IJ SOLN: 4.0000 mg | Freq: Four times a day (QID) | INTRAMUSCULAR | Status: DC | PRN

## 2019-05-12 MED ORDER — POVIDONE-IODINE 10 % EX SWAB
2.0000 "application " | Freq: Once | CUTANEOUS | Status: AC
  Administered 2019-05-12: 2 via TOPICAL

## 2019-05-12 MED ORDER — HYDROMORPHONE HCL 1 MG/ML IJ SOLN
INTRAMUSCULAR | Status: AC
  Filled 2019-05-12: qty 1

## 2019-05-12 MED ORDER — STERILE WATER FOR IRRIGATION IR SOLN
Status: DC | PRN
  Administered 2019-05-12: 2000 mL

## 2019-05-12 MED ORDER — ONDANSETRON HCL 4 MG/2ML IJ SOLN
INTRAMUSCULAR | Status: DC | PRN
  Administered 2019-05-12 (×2): 4 mg via INTRAVENOUS

## 2019-05-12 MED ORDER — PROPOFOL 10 MG/ML IV BOLUS
INTRAVENOUS | Status: DC | PRN
  Administered 2019-05-12: 75 ug/kg/min via INTRAVENOUS
  Administered 2019-05-12: 25 mg via INTRAVENOUS

## 2019-05-12 MED ORDER — ENOXAPARIN SODIUM 30 MG/0.3ML ~~LOC~~ SOLN: 30.0000 mg | Freq: Two times a day (BID) | SUBCUTANEOUS | Status: DC

## 2019-05-12 MED ORDER — SODIUM CHLORIDE (PF) 0.9 % IJ SOLN
INTRAMUSCULAR | Status: AC
  Filled 2019-05-12: qty 50

## 2019-05-12 MED ORDER — OXYCODONE HCL 5 MG PO TABS
5.0000 mg | ORAL_TABLET | ORAL | Status: DC | PRN
  Administered 2019-05-12 – 2019-05-13 (×2): 10 mg via ORAL
  Filled 2019-05-12 (×4): qty 2

## 2019-05-12 MED ORDER — SODIUM CHLORIDE 0.9 % IR SOLN
Status: DC | PRN
  Administered 2019-05-12: 1000 mL

## 2019-05-12 MED ORDER — METHOCARBAMOL 500 MG IVPB - SIMPLE MED
500.0000 mg | Freq: Four times a day (QID) | INTRAVENOUS | Status: DC | PRN
  Filled 2019-05-12: qty 50

## 2019-05-12 MED ORDER — PROPOFOL 500 MG/50ML IV EMUL
INTRAVENOUS | Status: AC
  Filled 2019-05-12: qty 50

## 2019-05-12 MED ORDER — ONDANSETRON HCL 4 MG PO TABS
4.0000 mg | ORAL_TABLET | Freq: Four times a day (QID) | ORAL | Status: DC | PRN
  Filled 2019-05-12: qty 1

## 2019-05-12 MED ORDER — ONDANSETRON HCL 4 MG/2ML IJ SOLN: 4.0000 mg | Freq: Once | INTRAMUSCULAR | Status: DC | PRN

## 2019-05-12 MED ORDER — SENNOSIDES-DOCUSATE SODIUM 8.6-50 MG PO TABS: 1.0000 | ORAL_TABLET | Freq: Every evening | ORAL | Status: DC | PRN

## 2019-05-12 MED ORDER — SODIUM CHLORIDE (PF) 0.9 % IJ SOLN
INTRAMUSCULAR | Status: AC
  Filled 2019-05-12: qty 10

## 2019-05-12 MED ORDER — TRANEXAMIC ACID-NACL 1000-0.7 MG/100ML-% IV SOLN
1000.0000 mg | INTRAVENOUS | Status: AC
  Administered 2019-05-12: 1000 mg via INTRAVENOUS
  Filled 2019-05-12: qty 100

## 2019-05-12 MED ORDER — FENTANYL CITRATE (PF) 100 MCG/2ML IJ SOLN
INTRAMUSCULAR | Status: AC
  Filled 2019-05-12: qty 2

## 2019-05-12 MED ORDER — ENOXAPARIN SODIUM 30 MG/0.3ML ~~LOC~~ SOLN
30.0000 mg | Freq: Two times a day (BID) | SUBCUTANEOUS | Status: DC
  Administered 2019-05-12 – 2019-05-13 (×2): 30 mg via SUBCUTANEOUS
  Filled 2019-05-12 (×2): qty 0.3

## 2019-05-12 MED ORDER — MIDAZOLAM HCL 5 MG/5ML IJ SOLN
INTRAMUSCULAR | Status: DC | PRN
  Administered 2019-05-12: 2 mg via INTRAVENOUS

## 2019-05-12 MED ORDER — BUPIVACAINE LIPOSOME 1.3 % IJ SUSP
20.0000 mL | Freq: Once | INTRAMUSCULAR | Status: AC
  Administered 2019-05-12: 20 mL
  Filled 2019-05-12: qty 20

## 2019-05-12 MED ORDER — METHOCARBAMOL 500 MG PO TABS: 500.0000 mg | ORAL_TABLET | Freq: Four times a day (QID) | ORAL | 0 refills | Status: DC

## 2019-05-12 MED ORDER — OXYCODONE HCL 5 MG PO TABS
10.0000 mg | ORAL_TABLET | ORAL | Status: DC | PRN
  Administered 2019-05-12 (×2): 10 mg via ORAL

## 2019-05-12 MED ORDER — EPHEDRINE SULFATE-NACL 50-0.9 MG/10ML-% IV SOSY
PREFILLED_SYRINGE | INTRAVENOUS | Status: DC | PRN
  Administered 2019-05-12 (×2): 5 mg via INTRAVENOUS

## 2019-05-12 MED ORDER — LACTATED RINGERS IV SOLN: INTRAVENOUS | Status: DC

## 2019-05-12 MED ORDER — PROPOFOL 10 MG/ML IV BOLUS
INTRAVENOUS | Status: AC
  Filled 2019-05-12: qty 40

## 2019-05-12 MED ORDER — ROPIVACAINE HCL 7.5 MG/ML IJ SOLN
INTRAMUSCULAR | Status: DC | PRN
  Administered 2019-05-12: 20 mL via PERINEURAL

## 2019-05-12 MED ORDER — GABAPENTIN 300 MG PO CAPS
300.0000 mg | ORAL_CAPSULE | Freq: Three times a day (TID) | ORAL | Status: DC
  Administered 2019-05-12 – 2019-05-13 (×3): 300 mg via ORAL
  Filled 2019-05-12 (×3): qty 1

## 2019-05-12 MED ORDER — ACETAMINOPHEN 500 MG PO TABS
1000.0000 mg | ORAL_TABLET | Freq: Four times a day (QID) | ORAL | Status: AC
  Administered 2019-05-12 – 2019-05-13 (×4): 1000 mg via ORAL
  Filled 2019-05-12 (×4): qty 2

## 2019-05-12 MED ORDER — HYDROMORPHONE HCL 1 MG/ML IJ SOLN
0.2500 mg | INTRAMUSCULAR | Status: DC | PRN
  Administered 2019-05-12 (×2): 0.5 mg via INTRAVENOUS

## 2019-05-12 MED ORDER — ROCURONIUM BROMIDE 10 MG/ML (PF) SYRINGE
PREFILLED_SYRINGE | INTRAVENOUS | Status: AC
  Filled 2019-05-12: qty 10

## 2019-05-12 MED ORDER — FENTANYL CITRATE (PF) 100 MCG/2ML IJ SOLN
INTRAMUSCULAR | Status: DC | PRN
  Administered 2019-05-12 (×2): 50 ug via INTRAVENOUS

## 2019-05-12 MED ORDER — MIDAZOLAM HCL 2 MG/2ML IJ SOLN
INTRAMUSCULAR | Status: AC
  Filled 2019-05-12: qty 2

## 2019-05-12 MED ORDER — ACETAMINOPHEN 325 MG PO TABS: 325.0000 mg | ORAL_TABLET | Freq: Four times a day (QID) | ORAL | Status: DC | PRN

## 2019-05-12 MED ORDER — SODIUM CHLORIDE (PF) 0.9 % IJ SOLN
INTRAMUSCULAR | Status: DC | PRN
  Administered 2019-05-12: 60 mL

## 2019-05-12 MED ORDER — HYDROMORPHONE HCL 1 MG/ML IJ SOLN: 0.5000 mg | INTRAMUSCULAR | Status: DC | PRN

## 2019-05-12 MED ORDER — GLYCOPYRROLATE PF 0.2 MG/ML IJ SOSY
PREFILLED_SYRINGE | INTRAMUSCULAR | Status: DC | PRN
  Administered 2019-05-12 (×2): .1 mg via INTRAVENOUS

## 2019-05-12 MED ORDER — PROPOFOL 500 MG/50ML IV EMUL
INTRAVENOUS | Status: AC
  Filled 2019-05-12: qty 100

## 2019-05-12 MED ORDER — CEFAZOLIN SODIUM-DEXTROSE 1-4 GM/50ML-% IV SOLN
1.0000 g | Freq: Three times a day (TID) | INTRAVENOUS | Status: AC
  Administered 2019-05-12 – 2019-05-13 (×3): 1 g via INTRAVENOUS
  Filled 2019-05-12 (×3): qty 50

## 2019-05-12 MED ORDER — PHENYLEPHRINE HCL-NACL 10-0.9 MG/250ML-% IV SOLN
INTRAVENOUS | Status: DC | PRN
  Administered 2019-05-12: 30 ug/min via INTRAVENOUS

## 2019-05-12 MED ORDER — BISACODYL 5 MG PO TBEC: 5.0000 mg | DELAYED_RELEASE_TABLET | Freq: Every day | ORAL | Status: DC | PRN

## 2019-05-12 MED ORDER — OXYCODONE HCL 5 MG PO TABS: 5.0000 mg | ORAL_TABLET | Freq: Three times a day (TID) | ORAL | 0 refills | Status: DC | PRN

## 2019-05-12 MED ORDER — DEXAMETHASONE SODIUM PHOSPHATE 10 MG/ML IJ SOLN
INTRAMUSCULAR | Status: DC | PRN
  Administered 2019-05-12: 8 mg via INTRAVENOUS

## 2019-05-12 MED ORDER — MEPERIDINE HCL 50 MG/ML IJ SOLN: 6.2500 mg | INTRAMUSCULAR | Status: DC | PRN

## 2019-05-12 MED ORDER — CEFAZOLIN SODIUM-DEXTROSE 2-4 GM/100ML-% IV SOLN
2.0000 g | INTRAVENOUS | Status: AC
  Administered 2019-05-12: 2 g via INTRAVENOUS
  Filled 2019-05-12: qty 100

## 2019-05-12 SURGICAL SUPPLY — 66 items
ATTUNE MED DOME PAT 32 KNEE (Knees) ×1 IMPLANT
ATTUNE PSFEM RTSZ4 NARCEM KNEE (Femur) ×1 IMPLANT
ATTUNE PSRP INSR SZ4 7 KNEE (Insert) ×1 IMPLANT
BAG DECANTER FOR FLEXI CONT (MISCELLANEOUS) IMPLANT
BAG ZIPLOCK 12X15 (MISCELLANEOUS) ×4 IMPLANT
BASE TIBIAL ROT PLAT SZ 3 KNEE (Knees) IMPLANT
BLADE SAG 18X100X1.27 (BLADE) ×2 IMPLANT
BLADE SAW SGTL 11.0X1.19X90.0M (BLADE) ×2 IMPLANT
BNDG ELASTIC 4X5.8 VLCR STR LF (GAUZE/BANDAGES/DRESSINGS) ×2 IMPLANT
BNDG ELASTIC 6X5.8 VLCR STR LF (GAUZE/BANDAGES/DRESSINGS) ×2 IMPLANT
BOWL SMART MIX CTS (DISPOSABLE) ×2 IMPLANT
CEMENT HV SMART SET (Cement) ×2 IMPLANT
COVER SURGICAL LIGHT HANDLE (MISCELLANEOUS) ×2 IMPLANT
COVER WAND RF STERILE (DRAPES) IMPLANT
CUFF TOURN SGL QUICK 34 (TOURNIQUET CUFF) ×1
CUFF TRNQT CYL 34X4.125X (TOURNIQUET CUFF) ×1 IMPLANT
DECANTER SPIKE VIAL GLASS SM (MISCELLANEOUS) ×2 IMPLANT
DERMABOND ADVANCED (GAUZE/BANDAGES/DRESSINGS) ×1
DERMABOND ADVANCED .7 DNX12 (GAUZE/BANDAGES/DRESSINGS) ×1 IMPLANT
DRAPE U-SHAPE 47X51 STRL (DRAPES) ×2 IMPLANT
DRSG AQUACEL AG ADV 3.5X10 (GAUZE/BANDAGES/DRESSINGS) ×2 IMPLANT
DRSG TEGADERM 4X4.75 (GAUZE/BANDAGES/DRESSINGS) ×2 IMPLANT
DURAPREP 26ML APPLICATOR (WOUND CARE) ×4 IMPLANT
ELECT REM PT RETURN 15FT ADLT (MISCELLANEOUS) ×2 IMPLANT
EVACUATOR 1/8 PVC DRAIN (DRAIN) ×2 IMPLANT
GAUZE SPONGE 2X2 8PLY STRL LF (GAUZE/BANDAGES/DRESSINGS) ×1 IMPLANT
GLOVE BIOGEL PI IND STRL 7.5 (GLOVE) ×1 IMPLANT
GLOVE BIOGEL PI IND STRL 8 (GLOVE) ×1 IMPLANT
GLOVE BIOGEL PI INDICATOR 7.5 (GLOVE) ×1
GLOVE BIOGEL PI INDICATOR 8 (GLOVE) ×1
GLOVE ECLIPSE 8.0 STRL XLNG CF (GLOVE) ×2 IMPLANT
GLOVE SURG ORTHO 9.0 STRL STRW (GLOVE) ×2 IMPLANT
GLOVE SURG SS PI 7.0 STRL IVOR (GLOVE) ×2 IMPLANT
GOWN STRL REUS W/TWL XL LVL3 (GOWN DISPOSABLE) ×4 IMPLANT
HANDPIECE INTERPULSE COAX TIP (DISPOSABLE) ×1
HOLDER FOLEY CATH W/STRAP (MISCELLANEOUS) IMPLANT
JET LAVAGE IRRISEPT WOUND (IRRIGATION / IRRIGATOR) ×2
KIT TURNOVER KIT A (KITS) IMPLANT
LAVAGE JET IRRISEPT WOUND (IRRIGATION / IRRIGATOR) ×1 IMPLANT
NS IRRIG 1000ML POUR BTL (IV SOLUTION) ×2 IMPLANT
PACK TOTAL KNEE CUSTOM (KITS) ×2 IMPLANT
PENCIL SMOKE EVACUATOR (MISCELLANEOUS) IMPLANT
PIN DRILL FIX HALF THREAD (BIT) ×1 IMPLANT
PIN STEINMAN FIXATION KNEE (PIN) ×1 IMPLANT
PROTECTOR NERVE ULNAR (MISCELLANEOUS) ×2 IMPLANT
SET HNDPC FAN SPRY TIP SCT (DISPOSABLE) ×1 IMPLANT
SET PAD KNEE POSITIONER (MISCELLANEOUS) ×2 IMPLANT
SPONGE GAUZE 2X2 STER 10/PKG (GAUZE/BANDAGES/DRESSINGS) ×1
SPONGE LAP 18X18 RF (DISPOSABLE) IMPLANT
SPONGE SURGIFOAM ABS GEL 100 (HEMOSTASIS) ×2 IMPLANT
STOCKINETTE 6  STRL (DRAPES) ×1
STOCKINETTE 6 STRL (DRAPES) ×1 IMPLANT
SUT BONE WAX W31G (SUTURE) IMPLANT
SUT MNCRL AB 3-0 PS2 18 (SUTURE) ×2 IMPLANT
SUT VIC AB 1 CT1 27 (SUTURE) ×3
SUT VIC AB 1 CT1 27XBRD ANTBC (SUTURE) ×3 IMPLANT
SUT VIC AB 2-0 CT1 27 (SUTURE) ×2
SUT VIC AB 2-0 CT1 TAPERPNT 27 (SUTURE) ×2 IMPLANT
SUT VLOC 180 0 24IN GS25 (SUTURE) ×2 IMPLANT
SYR 50ML LL SCALE MARK (SYRINGE) IMPLANT
TAPE STRIPS DRAPE STRL (GAUZE/BANDAGES/DRESSINGS) ×2 IMPLANT
TIBIAL BASE ROT PLAT SZ 3 KNEE (Knees) ×2 IMPLANT
TRAY FOLEY MTR SLVR 16FR STAT (SET/KITS/TRAYS/PACK) ×2 IMPLANT
WATER STERILE IRR 1000ML POUR (IV SOLUTION) ×4 IMPLANT
WRAP KNEE MAXI GEL POST OP (GAUZE/BANDAGES/DRESSINGS) ×2 IMPLANT
YANKAUER SUCT BULB TIP 10FT TU (MISCELLANEOUS) ×2 IMPLANT

## 2019-05-12 NOTE — Anesthesia Preprocedure Evaluation (Signed)
Anesthesia Evaluation  Patient identified by MRN, date of birth, ID band Patient awake    Reviewed: Allergy & Precautions, NPO status , Patient's Chart, lab work & pertinent test results  Airway Mallampati: I  TM Distance: >3 FB Neck ROM: Full    Dental   Pulmonary    Pulmonary exam normal        Cardiovascular hypertension, Pt. on medications Normal cardiovascular exam     Neuro/Psych    GI/Hepatic GERD  Medicated and Controlled,  Endo/Other    Renal/GU      Musculoskeletal   Abdominal   Peds  Hematology   Anesthesia Other Findings   Reproductive/Obstetrics                             Anesthesia Physical Anesthesia Plan  ASA: II  Anesthesia Plan: Spinal and MAC   Post-op Pain Management:  Regional for Post-op pain   Induction: Intravenous  PONV Risk Score and Plan: 2 and Ondansetron and Midazolam  Airway Management Planned: Nasal Cannula  Additional Equipment:   Intra-op Plan:   Post-operative Plan:   Informed Consent: I have reviewed the patients History and Physical, chart, labs and discussed the procedure including the risks, benefits and alternatives for the proposed anesthesia with the patient or authorized representative who has indicated his/her understanding and acceptance.       Plan Discussed with: CRNA and Surgeon  Anesthesia Plan Comments:         Anesthesia Quick Evaluation

## 2019-05-12 NOTE — Op Note (Signed)
DATE OF SURGERY:  05/12/2019  TIME: 9:59 AM  PATIENT NAME:  Candice Richardson    AGE: 73 y.o.   PRE-OPERATIVE DIAGNOSIS:  Right knee osteoarthritis  POST-OPERATIVE DIAGNOSIS:  Right knee osteoarthritis  PROCEDURE:  Procedure(s): TOTAL KNEE ARTHROPLASTY  SURGEON:  Beyonka Pitney ANDREW  ASSISTANT:  Leeanne Haus, PA-C, present and scrubbed throughout the case, critical for assistance with exposure, retraction, instrumentation, and closure.  OPERATIVE IMPLANTS: Depuy PFC Attune Rotating Platform.  Femur size 4, Tibia size 3, Patella size 32 3-peg oval button, with a 7 mm polyethylene insert.   PREOPERATIVE INDICATIONS:   Candice Richardson is a 73 y.o. year old female with end stage bone on bone arthritis of the knee who failed conservative treatment and elected for Total Knee Arthroplasty.   The risks, benefits, and alternatives were discussed at length including but not limited to the risks of infection, bleeding, nerve injury, stiffness, blood clots, the need for revision surgery, cardiopulmonary complications, among others, and they were willing to proceed.  OPERATIVE DESCRIPTION:  The patient was brought to the operative room and placed in a supine position.  Spinal anesthesia was administered.  IV antibiotics were given.  The lower extremity was prepped and draped in the usual sterile fashion.  Time out was performed.  The leg was elevated and exsanguinated and the tourniquet was inflated.  Anterior quadriceps tendon splitting approach was performed.  The patella was retracted and osteophytes were removed.  The anterior horn of the medial and lateral meniscus was removed and cruciate ligaments resected.   The distal femur was opened with the drill and the intramedullary distal femoral cutting jig was utilized, set at 5 degrees resecting 10 mm off the distal femur.  Care was taken to protect the collateral ligaments.  The distal femoral sizing jig was applied, taking care to avoid  notching.  Then the 4-in-1 cutting jig was applied and the anterior and posterior femur was cut, along with the chamfer cuts.    Then the extramedullary tibial cutting jig was utilized making the appropriate cut using the anterior tibial crest as a reference building in appropriate posterior slope.  Care was taken during the cut to protect the medial and collateral ligaments.  The proximal tibia was removed along with the posterior horns of the menisci.   The posterior medial femoral osteophytes and posterior lateral femoral osteophytes were removed.    The flexion gap was then measured and was symmetric with the extension gap, measured at 7.  I completed the distal femoral preparation using the appropriate jig to prepare the box.  The patella was then measured, and cut with the saw.    The proximal tibia sized and prepared accordingly with the reamer and the punch, and then all components were trialed with the trial insert.  The knee was found to have excellent balance and full motion.    The above named components were then cemented into place and all excess cement was removed.  The trial polyethylene component was in place during cementation, and then was exchanged for the real polyethylene component.    The knee was easily taken through a range of motion and the patella tracked well and the knee irrigated copiously and the parapatellar and subcutaneous tissue closed with vicryl, and monocryl with steri strips for the skin.  The arthrotomy was closed at 90 of flexion. The wounds were dressed with sterile gauze and the tourniquet released and the patient was awakened and returned to the PACU in stable and  satisfactory condition.  There were no complications.  Total tourniquet time was 89 minutes.

## 2019-05-12 NOTE — Interval H&P Note (Signed)
History and Physical Interval Note:  05/12/2019 7:40 AM  Candice Richardson  has presented today for surgery, with the diagnosis of Right knee osteoarthritis.  The various methods of treatment have been discussed with the patient and family. After consideration of risks, benefits and other options for treatment, the patient has consented to  Procedure(s) with comments: TOTAL KNEE ARTHROPLASTY (Right) - adductor canal as a surgical intervention.  The patient's history has been reviewed, patient examined, no change in status, stable for surgery.  I have reviewed the patient's chart and labs.  Questions were answered to the patient's satisfaction.     [name]   

## 2019-05-12 NOTE — Anesthesia Postprocedure Evaluation (Signed)
Anesthesia Post Note  Patient: Candice Richardson  Procedure(s) Performed: TOTAL KNEE ARTHROPLASTY (Right Knee)     Patient location during evaluation: PACU Anesthesia Type: MAC Level of consciousness: oriented and awake and alert Pain management: pain level controlled Vital Signs Assessment: post-procedure vital signs reviewed and stable Respiratory status: spontaneous breathing, respiratory function stable and patient connected to nasal cannula oxygen Cardiovascular status: blood pressure returned to baseline and stable Postop Assessment: no headache, no backache and no apparent nausea or vomiting Anesthetic complications: no    Last Vitals:  Vitals:   05/12/19 1148 05/12/19 1338  BP: 117/62 125/68  Pulse: 67 67  Resp:  16  Temp: 36.4 C 36.5 C  SpO2: 100% 100%    Last Pain:  Vitals:   05/12/19 1130  TempSrc:   PainSc: [place]

## 2019-05-12 NOTE — Interval H&P Note (Signed)
History and Physical Interval Note:  05/12/2019 7:40 AM  Candice Richardson  has presented today for surgery, with the diagnosis of Right knee osteoarthritis.  The various methods of treatment have been discussed with the patient and family. After consideration of risks, benefits and other options for treatment, the patient has consented to  Procedure(s) with comments: TOTAL KNEE ARTHROPLASTY (Right) - adductor canal as a surgical intervention.  The patient's history has been reviewed, patient examined, no change in status, stable for surgery.  I have reviewed the patient's chart and labs.  Questions were answered to the patient's satisfaction.     Candice Richardson

## 2019-05-12 NOTE — Evaluation (Signed)
Physical Therapy Evaluation Patient Details Name: Candice Richardson MRN: [id number] DOB: September 03, 1945 Today's Date: 05/12/2019   History of Present Illness  Patient is 73 y.o. female s/p Rt TKA on 05/12/19 with PMH significant for HLD, HTN, OA, GERD, and Lt TKA in 2017.  Clinical Impression  Candice Richardson is a 73 y.o. female POD 0 s/p Rt TKA. Patient reports independence with mobility at baseline. Patient is now limited by functional impairments (see PT problem list below) and requires min assist for transfers and gait with RW. Patient was able to ambulate ~30 feet with RW and min assist with cues for step pattern. Patient instructed in exercise to facilitate ROM and circulation. Patient will benefit from continued skilled PT interventions to address impairments and progress towards PLOF. Acute PT will follow to progress mobility and stair training in preparation for safe discharge home.    Follow Up Recommendations Follow surgeon's recommendation for DC plan and follow-up therapies    Equipment Recommendations  None recommended by PT    Recommendations for Other Services       Precautions / Restrictions Precautions Precautions: Fall Restrictions Weight Bearing Restrictions: No      Mobility  Bed Mobility Overal bed mobility: Needs Assistance Bed Mobility: Supine to Sit     Supine to sit: HOB elevated;Min assist     General bed mobility comments: cues for use of bed rail, assist to fully raise trunk upright  Transfers Overall transfer level: Needs assistance Equipment used: Rolling walker (2 wheeled) Transfers: Sit to/from Stand Sit to Stand: Min assist         General transfer comment: cues for safe hand placement and technique with RW, assist to initiate power up and steady to rise  Ambulation/Gait Ambulation/Gait assistance: Min assist;Min guard Gait Distance (Feet): 30 Feet   Gait Pattern/deviations: Step-through pattern;Decreased stance time - right;Decreased  step length - left;Decreased stride length Gait velocity: decreased   General Gait Details: cues for safe step pattern and to maintain safe proximity to RW, assist to negotiate walker during turn in doorway, no overt LOB noted  Stairs            Wheelchair Mobility    Modified Rankin (Stroke Patients Only)       Balance Overall balance assessment: Needs assistance Sitting-balance support: Feet supported Sitting balance-Leahy Scale: Good     Standing balance support: During functional activity;Bilateral upper extremity supported Standing balance-Leahy Scale: Poor                Pertinent Vitals/Pain Pain Assessment: 0-10 Pain Score: 9  Pain Location: Rt knee Pain Descriptors / Indicators: Sore;Aching Pain Intervention(s): Limited activity within patient's tolerance;Monitored during session;Repositioned    Home Living Family/patient expects to be discharged to:: Private residence Living Arrangements: Alone Available Help at Discharge: Family;Available 24 hours/day Type of Home: House Home Access: Stairs to enter Entrance Stairs-Rails: Right Entrance Stairs-Number of Steps: 2 Home Layout: Two level;Able to live on main level with bedroom/bathroom;Full bath on main level Home Equipment: Walker - 2 wheels;Cane - single point;Shower seat;Grab bars - toilet;Hand held shower head      Prior Function Level of Independence: Independent         Comments: pt's son is going to stay with her for as long as needed, last time he stayed with her for ~ 5 days.     Hand Dominance   Dominant Hand: Right    Extremity/Trunk Assessment   Upper Extremity Assessment Upper Extremity Assessment: Overall WFL for  tasks assessed    Lower Extremity Assessment Lower Extremity Assessment: RLE deficits/detail RLE Deficits / Details: good quad activation and no extensor lag with SLR RLE Sensation: WNL RLE Coordination: WNL    Cervical / Trunk Assessment Cervical / Trunk  Assessment: Normal  Communication   Communication: No difficulties  Cognition Arousal/Alertness: Awake/alert Behavior During Therapy: WFL for tasks assessed/performed Overall Cognitive Status: Within Functional Limits for tasks assessed             General Comments      Exercises Total Joint Exercises Ankle Circles/Pumps: AROM;15 reps;Seated;Both Quad Sets: AROM;5 reps;Supine;Right Heel Slides: AROM;10 reps;Seated;Right   Assessment/Plan    PT Assessment Patient needs continued PT services  PT Problem List Decreased activity tolerance;Decreased strength;Decreased range of motion;Decreased balance;Decreased mobility;Decreased knowledge of use of DME       PT Treatment Interventions DME instruction;Functional mobility training;Balance training;Patient/family education;Gait training;Therapeutic activities;Stair training;Therapeutic exercise    PT Goals (Current goals can be found in the Care Plan section)  Acute Rehab PT Goals Patient Stated Goal: go home and get back to independent level PT Goal Formulation: With patient Time For Goal Achievement: 05/19/19 Potential to Achieve Goals: Good    Frequency 7X/week    AM-PAC PT "6 Clicks" Mobility  Outcome Measure Help needed turning from your back to your side while in a flat bed without using bedrails?: A Little Help needed moving from lying on your back to sitting on the side of a flat bed without using bedrails?: A Little Help needed moving to and from a bed to a chair (including a wheelchair)?: A Little Help needed standing up from a chair using your arms (e.g., wheelchair or bedside chair)?: A Little Help needed to walk in hospital room?: A Little Help needed climbing 3-5 steps with a railing? : A Little 6 Click Score: 18    End of Session Equipment Utilized During Treatment: Gait belt Activity Tolerance: Patient tolerated treatment well Patient left: with call bell/phone within reach;in chair;with family/visitor  present;with chair alarm set Nurse Communication: Mobility status PT Visit Diagnosis: Muscle weakness (generalized) (M62.81);Difficulty in walking, not elsewhere classified (R26.2)    Time: 9371-6967 PT Time Calculation (min) (ACUTE ONLY): 30 min   Charges:   PT Evaluation $PT Eval Low Complexity: 1 Low PT Treatments $Therapeutic Exercise: 8-22 mins       Renaldo Fiddler PT, DPT Physical Therapist with [facility]  05/12/2019 6:34 PM

## 2019-05-12 NOTE — Transfer of Care (Signed)
Immediate Anesthesia Transfer of Care Note  Patient: Candice Richardson  Procedure(s) Performed: Procedure(s) with comments: TOTAL KNEE ARTHROPLASTY (Right) - adductor canal  Patient Location: PACU  Anesthesia Type:Spinal  Level of Consciousness:  sedated, patient cooperative and responds to stimulation  Airway & Oxygen Therapy:Patient Spontanous Breathing and Patient connected to face mask oxgen  Post-op Assessment:  Report given to PACU RN and Post -op Vital signs reviewed and stable  Post vital signs:  Reviewed and stable  Last Vitals:  Vitals:   05/12/19 0540  BP: (!) 128/51  Pulse: 68  Resp: 15  Temp: 37.1 C  SpO2: 24%    Complications: No apparent anesthesia complications

## 2019-05-12 NOTE — Anesthesia Procedure Notes (Signed)
Spinal  Patient location during procedure: OR Start time: 05/12/2019 7:44 AM End time: 05/12/2019 7:47 AM Staffing Performed: anesthesiologist  Anesthesiologist: Lillia Abed, MD Preanesthetic Checklist Completed: patient identified, IV checked, risks and benefits discussed, surgical consent, monitors and equipment checked, pre-op evaluation and timeout performed Spinal Block Patient position: sitting Prep: Betadine Patient monitoring: heart rate, cardiac monitor, continuous pulse ox and blood pressure Approach: right paramedian Location: L3-4 Injection technique: single-shot Needle Needle type: Pencan  Needle gauge: 24 G Needle length: 9 cm Needle insertion depth: 5 cm

## 2019-05-12 NOTE — Anesthesia Procedure Notes (Signed)
Anesthesia Regional Block: Adductor canal block   Pre-Anesthetic Checklist: ,, timeout performed, Correct Patient, Correct Site, Correct Laterality, Correct Procedure, Correct Position, site marked, Risks and benefits discussed,  Surgical consent,  Pre-op evaluation,  At surgeon's request and post-op pain management  Laterality: Right  Prep: chloraprep       Needles:  Injection technique: Single-shot  Needle Type: Echogenic Stimulator Needle     Needle Length: 9cm  Needle Gauge: 21     Additional Needles:   Narrative:  Start time: 05/12/2019 7:00 AM End time: 05/12/2019 7:10 AM Injection made incrementally with aspirations every 5 mL.  Performed by: Personally  Anesthesiologist: Lillia Abed, MD  Additional Notes: Monitors applied. Patient sedated. Sterile prep and drape,hand hygiene and sterile gloves were used. Relevant anatomy identified.Needle position confirmed.Local anesthetic injected incrementally after negative aspiration. Local anesthetic spread visualized around nerve(s). Vascular puncture avoided. No complications. Image printed for medical record.The patient tolerated the procedure well.    Lillia Abed MD

## 2019-05-13 ENCOUNTER — Encounter (HOSPITAL_COMMUNITY): Payer: Self-pay | Admitting: Specialist

## 2019-05-13 MED ORDER — ROSUVASTATIN CALCIUM 20 MG PO TABS
20.0000 mg | ORAL_TABLET | Freq: Every day | ORAL | Status: DC
  Administered 2019-05-13: 20 mg via ORAL
  Filled 2019-05-13: qty 1

## 2019-05-13 MED ORDER — METOPROLOL SUCCINATE ER 50 MG PO TB24: 50.0000 mg | ORAL_TABLET | Freq: Every day | ORAL | Status: DC

## 2019-05-13 MED ORDER — AMLODIPINE BESYLATE 5 MG PO TABS: 5.0000 mg | ORAL_TABLET | Freq: Every evening | ORAL | Status: DC

## 2019-05-13 NOTE — Progress Notes (Signed)
Physical Therapy Treatment Patient Details Name: Candice Richardson MRN: [id number] DOB: 12-May-1946 Today's Date: 05/13/2019    History of Present Illness Patient is 73 y.o. female s/p Rt TKA on 05/12/19 with PMH significant for HLD, HTN, OA, GERD, and Lt TKA in 2017.    PT Comments    Pt is POD # 1 and is progressing well.  Pt demonstrates safe gait & transfers in order to return home from PT perspective once discharged by MD.  She ambulated 200' and performed stairs.  Demonstrates good understanding of HEP and with good pain control. While in hospital, will continue to benefit from PT for skilled therapy to advance mobility and exercises.      Follow Up Recommendations  Follow surgeon's recommendation for DC plan and follow-up therapies     Equipment Recommendations  None recommended by PT    Recommendations for Other Services       Precautions / Restrictions Precautions Precautions: Fall Restrictions Weight Bearing Restrictions: No    Mobility  Bed Mobility Overal bed mobility: Needs Assistance Bed Mobility: Supine to Sit     Supine to sit: Supervision     General bed mobility comments: cued to use gait belt as leg lifter as needed  Transfers Overall transfer level: Needs assistance Equipment used: Rolling walker (2 wheeled) Transfers: Sit to/from Stand Sit to Stand: Supervision         General transfer comment: cued for safe hand placement  Ambulation/Gait Ambulation/Gait assistance: Supervision Gait Distance (Feet): 200 Feet Assistive device: Rolling walker (2 wheeled) Gait Pattern/deviations: Step-through pattern Gait velocity: near normal   General Gait Details: progressed to step through pattern with cues; slight decrease in stance time on right   Stairs Stairs: Yes Stairs assistance: Min guard Stair Management: One rail Left;Two rails Number of Stairs: 6 General stair comments: performed 3 steps with 2 rails and then 3 with Left rail; cued for  sequence   Wheelchair Mobility    Modified Rankin (Stroke Patients Only)       Balance Overall balance assessment: Needs assistance Sitting-balance support: Feet supported Sitting balance-Leahy Scale: Good     Standing balance support: During functional activity;Bilateral upper extremity supported Standing balance-Leahy Scale: Good                              Cognition Arousal/Alertness: Awake/alert Behavior During Therapy: WFL for tasks assessed/performed Overall Cognitive Status: Within Functional Limits for tasks assessed                                        Exercises Total Joint Exercises (cued for exercise technique initially and use of gait belt as needed to assist if needed) Ankle Circles/Pumps: AROM;Both;10 reps;Supine Quad Sets: AROM;Both;10 reps;Supine Towel Squeeze: AROM;Both;10 reps;Supine Heel Slides: AAROM;Right;10 reps;Supine Hip ABduction/ADduction: AROM;Right;10 reps;Supine Straight Leg Raises: AROM;Right;10 reps;Supine Long Arc Quad: AROM;Right;10 reps;Seated Knee Flexion: AROM;10 reps;Right;Seated Goniometric ROM: R knee ROM 5-85 degrees (in ACE wrap)    General Comments        Pertinent Vitals/Pain Pain Assessment: No/denies pain    Home Living                      Prior Function            PT Goals (current goals can now be found in the care plan section)  Progress towards PT goals: Progressing toward goals    Frequency    7X/week      PT Plan Current plan remains appropriate    Co-evaluation              AM-PAC PT "6 Clicks" Mobility   Outcome Measure  Help needed turning from your back to your side while in a flat bed without using bedrails?: None Help needed moving from lying on your back to sitting on the side of a flat bed without using bedrails?: None Help needed moving to and from a bed to a chair (including a wheelchair)?: None Help needed standing up from a chair using  your arms (e.g., wheelchair or bedside chair)?: None Help needed to walk in hospital room?: None Help needed climbing 3-5 steps with a railing? : None 6 Click Score: 24    End of Session Equipment Utilized During Treatment: Gait belt Activity Tolerance: Patient tolerated treatment well Patient left: with call bell/phone within reach;in chair;with family/visitor present;with chair alarm set Nurse Communication: Mobility status PT Visit Diagnosis: Muscle weakness (generalized) (M62.81);Difficulty in walking, not elsewhere classified (R26.2)     Time: 7124-5809 PT Time Calculation (min) (ACUTE ONLY): 25 min  Charges:  $Gait Training: 8-22 mins $Therapeutic Exercise: 8-22 mins                     Royetta Asal, PT Acute Rehab Services Pager [phone] [facility] [phone] [facility] [phone]    Rayetta Humphrey 05/13/2019, 10:53 AM

## 2019-05-13 NOTE — Discharge Summary (Signed)
Physician Discharge Summary  Patient ID: Candice Richardson MRN: [id number] DOB/AGE: [birth year] 73 y.o.  Admit date: 05/12/2019 Discharge date: 05/13/2019  Admission Diagnoses: Right knee osteoarthritis Discharge Diagnoses:  Active Problems:   Osteoarthritis of right knee   Discharged Condition: stable  Hospital Course: patient was admitted on December 18 for right knee end-stage osteoarthritis. She was scheduled for a right total knee arthroplasty on December 18.  Patient tolerated surgery well.  Was taken to PACU in stable condition.  No events.  Was taken up to postop floor with no issues.  Patient had her Foley removed, was able to void without any issues.  She is able to get up with physical therapy, ambulation with walker.  No complaint  No events overnight.  Postop day 1 patient was doing well.  Pain was well controlled.  She was up with physical therapy and tolerated therapy well.  Patient was able to be discharged postop day 1. Will be starting PT this week. Aspirin 325 mg tabs twice daily for 6 weeks. Medications sent in to pharmacy for patient prior to discharge.   Consults: None  Significant Diagnostic Studies: none  Treatments: antibiotics-Ancef DVT prophylaxis-Lovenox  Discharge Exam: Blood pressure (!) 120/49, pulse 66, temperature 98 F (36.7 C), resp. rate 16, height 5\' 6"  (1.676 m), weight 69.4 kg, SpO2 98 %. Head: Normocephalic, without obvious abnormality, atraumatic Extremities: extremities normal, atraumatic, no cyanosis or edema, Homans sign is negative, no sign of DVT and no edema, redness or tenderness in the calves or thighs Pulses: 2+ and symmetric Skin: Skin color, texture, turgor normal. No rashes or lesions Neurologic: Grossly normal Incision/Wound: dressings were clean dry and intact  Disposition: Discharge disposition: 01-Home or Self Care       Discharge Instructions    Call MD / Call 911   Complete by: As directed    If you experience chest  pain or shortness of breath, CALL 911 and be transported to the hospital emergency room.  If you develope a fever above 101 F, pus (white drainage) or increased drainage or redness at the wound, or calf pain, call your surgeon's office.   Constipation Prevention   Complete by: As directed    Drink plenty of fluids.  Prune juice may be helpful.  You may use a stool softener, such as Colace (over the counter) 100 mg twice a day.  Use MiraLax (over the counter) for constipation as needed.   Diet - low sodium heart healthy   Complete by: As directed    Discharge instructions   Complete by: As directed    Keep dressings on dry and intact until 2-week follow-up appointment Okay to remove Ace bandage on Monday, place Ted stocking on after Ace bandages removed Okay to shower needs to put wrap over top of bandage to keep dry Take aspirin 325 mg tablets twice daily Start physical therapy this upcoming week   Increase activity slowly as tolerated   Complete by: As directed    TED hose   Complete by: As directed    Use stockings (TED hose) for 2 weeks on surgical leg(s).  You may remove them at night for sleeping.     Allergies as of 05/13/2019   No Known Allergies     Medication List    STOP taking these medications   amoxicillin-clavulanate 875-125 MG tablet Commonly known as: AUGMENTIN   diclofenac Sodium 1 % Gel Commonly known as: VOLTAREN   Hydromet 5-1.5 MG/5ML syrup Generic drug: HYDROcodone-homatropine  TAKE these medications   albuterol 108 (90 Base) MCG/ACT inhaler Commonly known as: VENTOLIN HFA Take 2 puffs by mouth every 4 (four) hours as needed.   amLODipine 5 MG tablet Commonly known as: NORVASC Take 5 mg by mouth every evening.   CALTRATE 600+D PO Take 1 tablet by mouth daily.   Co Q-10 100 MG Caps Take 100 mg by mouth daily.   DIALYVITE VITAMIN D 5000 PO Take 5,000 Units by mouth daily.   estradiol 0.1 MG/GM vaginal cream Commonly known as:  ESTRACE Place 1 Applicatorful vaginally once a week.   Melatonin 3 MG Tabs Take 3 mg by mouth daily.   methocarbamol 500 MG tablet Commonly known as: Robaxin Take 1 tablet (500 mg total) by mouth 4 (four) times daily.   metoprolol succinate 50 MG 24 hr tablet Commonly known as: TOPROL-XL Take 50 mg by mouth daily. Take with or immediately following a meal.   multivitamin with minerals tablet Take 1 tablet by mouth daily.   omeprazole 20 MG capsule Commonly known as: PRILOSEC Take 20 mg by mouth every evening.   ondansetron 4 MG tablet Commonly known as: Zofran Take 1 tablet (4 mg total) by mouth daily as needed for nausea or vomiting.   oxyCODONE 5 MG immediate release tablet Commonly known as: Roxicodone Take 1 tablet (5 mg total) by mouth every 8 (eight) hours as needed.   rosuvastatin 20 MG tablet Commonly known as: CRESTOR Take 20 mg by mouth daily.   vitamin B-12 1000 MCG tablet Commonly known as: CYANOCOBALAMIN Take 1,000 mcg by mouth daily.   vitamin C 1000 MG tablet Take 1,000 mg by mouth daily.        Signed: Cherie Dark, PA-C Orthopedic Surgery 05/13/2019, 11:52 AM

## 2019-05-13 NOTE — TOC Progression Note (Signed)
Transition of Care [facility]) - Progression Note    Patient Details  Name: Candice Richardson MRN: [id number] Date of Birth: 1945/07/29  [facility] CM/SW Contact  Joaquin Courts, RN Phone Number: 05/13/2019, 12:35 PM  Clinical Narrative:    CM spoke with patient at bedside who reports she has an OPPT appointment on Tuesday 05/16/19. Patient reports she has rolling walker and 3-in-1 at home.   Expected Discharge Plan: Home/Self Care Barriers to Discharge: No Barriers Identified  Expected Discharge Plan and Services Expected Discharge Plan: Home/Self Care   Discharge Planning Services: CM Consult   Living arrangements for the past 2 months: Single Family Home Expected Discharge Date: 05/13/19               DME Arranged: N/A DME Agency: NA       HH Arranged: NA HH Agency: NA         Social Determinants of Health (SDOH) Interventions    Readmission Risk Interventions No flowsheet data found.

## 2019-05-13 NOTE — Plan of Care (Signed)
  Problem: Education: Goal: Knowledge of the prescribed therapeutic regimen will improve Outcome: Progressing   Problem: Pain Management: Goal: Pain level will decrease with appropriate interventions Outcome: Progressing   

## 2019-05-13 NOTE — Progress Notes (Signed)
Subjective: 1 Day Post-Op Procedure(s) (LRB): TOTAL KNEE ARTHROPLASTY (Right) Patient reports pain as mild.   Patient up with physical therapy yesterday with no complaints Not having any issues at this time, no events overnight, doing well this morning No chest pain, shortness of breath, nausea vomiting Patient was able to void, positive flatus  Objective: Vital signs in last 24 hours: Temp:  [97.5 F (36.4 C)-98.3 F (36.8 C)] 97.6 F (36.4 C) (12/19 0545) Pulse Rate:  [65-81] 70 (12/19 0545) Resp:  [12-19] 18 (12/19 0545) BP: (105-135)/(54-81) 131/67 (12/19 0545) SpO2:  [95 %-100 %] 97 % (12/19 0545)  Intake/Output from previous day: 12/18 0701 - 12/19 0700 In: 2730 [P.O.:780; I.V.:1850; IV Piggyback:100] Out: 2150 [Urine:2050; Drains:25; Blood:75] Intake/Output this shift: No intake/output data recorded.  No results for input(s): HGB in the last 72 hours. No results for input(s): WBC, RBC, HCT, PLT in the last 72 hours. No results for input(s): NA, K, CL, CO2, BUN, CREATININE, GLUCOSE, CALCIUM in the last 72 hours. No results for input(s): LABPT, INR in the last 72 hours.  Neurologically intact ABD soft Neurovascular intact Sensation intact distally Intact pulses distally Dorsiflexion/Plantar flexion intact Incision: dressing C/D/I Compartment soft  Neck drain removed today, about 15 mL of serosanguineous fluid noted  Assessment/Plan: 1 Day Post-Op Procedure(s) (LRB): TOTAL KNEE ARTHROPLASTY (Right) Advance diet Up with therapy D/C IV fluids Discharge home with home health Will be able to get home today, discharge order placed pending her ambulation physical therapy Medication sent to pharmacy Drain was removed today no concerns Patient is to keep her dressing on dry and intact until follow-up appointment   Drue Novel, PA-C Orthopedic surgery [phone] 05/13/2019, 9:52 AM

## 2019-05-13 NOTE — Progress Notes (Signed)
Pt provided with d/c instructions. After discussing the pt's plan of care upon d/c home, the pt reported no further questions or concerns.  

## 2019-05-16 ENCOUNTER — Encounter: Payer: Self-pay | Admitting: *Deleted

## 2019-05-16 DIAGNOSIS — M25661 Stiffness of right knee, not elsewhere classified: Secondary | ICD-10-CM | POA: Diagnosis not present

## 2019-05-24 DIAGNOSIS — M25661 Stiffness of right knee, not elsewhere classified: Secondary | ICD-10-CM | POA: Diagnosis not present

## 2019-05-29 DIAGNOSIS — Z4789 Encounter for other orthopedic aftercare: Secondary | ICD-10-CM | POA: Diagnosis not present

## 2019-05-29 DIAGNOSIS — M25661 Stiffness of right knee, not elsewhere classified: Secondary | ICD-10-CM | POA: Diagnosis not present

## 2019-05-31 DIAGNOSIS — M25661 Stiffness of right knee, not elsewhere classified: Secondary | ICD-10-CM | POA: Diagnosis not present

## 2019-06-02 DIAGNOSIS — M25661 Stiffness of right knee, not elsewhere classified: Secondary | ICD-10-CM | POA: Diagnosis not present

## 2019-06-04 ENCOUNTER — Ambulatory Visit: Payer: Medicare Other

## 2019-06-05 DIAGNOSIS — M25661 Stiffness of right knee, not elsewhere classified: Secondary | ICD-10-CM | POA: Diagnosis not present

## 2019-06-07 DIAGNOSIS — M25661 Stiffness of right knee, not elsewhere classified: Secondary | ICD-10-CM | POA: Diagnosis not present

## 2019-06-09 DIAGNOSIS — M25661 Stiffness of right knee, not elsewhere classified: Secondary | ICD-10-CM | POA: Diagnosis not present

## 2019-06-14 DIAGNOSIS — M25661 Stiffness of right knee, not elsewhere classified: Secondary | ICD-10-CM | POA: Diagnosis not present

## 2019-06-16 DIAGNOSIS — M25661 Stiffness of right knee, not elsewhere classified: Secondary | ICD-10-CM | POA: Diagnosis not present

## 2019-06-21 DIAGNOSIS — M25661 Stiffness of right knee, not elsewhere classified: Secondary | ICD-10-CM | POA: Diagnosis not present

## 2019-06-22 DIAGNOSIS — Z124 Encounter for screening for malignant neoplasm of cervix: Secondary | ICD-10-CM | POA: Diagnosis not present

## 2019-06-22 DIAGNOSIS — Z01419 Encounter for gynecological examination (general) (routine) without abnormal findings: Secondary | ICD-10-CM | POA: Diagnosis not present

## 2019-06-22 DIAGNOSIS — N952 Postmenopausal atrophic vaginitis: Secondary | ICD-10-CM | POA: Diagnosis not present

## 2019-06-23 DIAGNOSIS — M25661 Stiffness of right knee, not elsewhere classified: Secondary | ICD-10-CM | POA: Diagnosis not present

## 2019-07-03 ENCOUNTER — Other Ambulatory Visit: Payer: Self-pay

## 2019-07-03 ENCOUNTER — Ambulatory Visit: Payer: Medicare Other | Admitting: Cardiology

## 2019-07-03 ENCOUNTER — Encounter: Payer: Self-pay | Admitting: Cardiology

## 2019-07-03 VITALS — BP 110/76 | HR 86 | Ht 66.0 in | Wt 150.0 lb

## 2019-07-03 DIAGNOSIS — R55 Syncope and collapse: Secondary | ICD-10-CM | POA: Diagnosis not present

## 2019-07-03 DIAGNOSIS — E7800 Pure hypercholesterolemia, unspecified: Secondary | ICD-10-CM

## 2019-07-03 DIAGNOSIS — Z8249 Family history of ischemic heart disease and other diseases of the circulatory system: Secondary | ICD-10-CM

## 2019-07-03 NOTE — Progress Notes (Signed)
Cardiology Office Note:    Date:  07/03/2019   ID:  Candice, Richardson [birth year], MRN [id number]  PCP:  Jarome Matin, MD  Cardiologist:  Donato Schultz, MD  Electrophysiologist:  None   Referring MD: Jarome Matin, MD     History of Present Illness:    Candice Richardson is a 74 y.o. female here for the evaluation of syncope and pre op evaluation.   Has seen Dr. Tresa Endo in the past 10/15/17 with HTN, GERD, hyperlipidemia.   Had frequent [facility] and stress tests in [place]. Was originally seen here by Dr. Tresa Endo in 2015, Dr. Jarold Motto referred. Left sided CP.    Had several tests. ECHO normal with mild PA pressure elevation. NUC stress EF 72 with no ischemia.Prior ETT with 88mm ST depression without symptoms.  LDL 79   She has a very strong family history for CAD and in 2018 her sister suffered a myocardial infarction and had 3 stents.  Her brother had a pacemaker and is suffered an MI.  Another brother died and previously had CABG revascularization surgery.  Her mother is also had a myocardial infarction  Blood work, driving home, felt faint, had to pull over, blamed it on blood. Feels a racing up sensation.  Rushing-like flush-like sensation.  Christmas. Daughter in law is NP was there. Took BP 178/90. Water was given. Has felt nausea but no sweat. Never fainted. Never had these before.   Recent bout with divertic. Different.  Does drink ETOH.  Wine.  Has been recently holding this.  Felt a little light headed even at meeting. 94bpm at rest.   Encouraged her to increase her fluid intake, water.  07/03/19 office visit- knee replacement 7 week ago. Passed PT. Brother died at 25 in Dec 31, 2022.  Overall she has been doing well.  No further near syncope episodes.  She is compliant with her medications.  No myalgias on Crestor.  Past Medical History:  Diagnosis Date  . Arthritis   . Diverticulitis   . GERD (gastroesophageal reflux disease)   . Heart murmur   . Hyperlipidemia     . Hypertension   . Torn meniscus     Past Surgical History:  Procedure Laterality Date  . APPENDECTOMY  1947   2 months old  . KNEE ARTHROSCOPY W/ MENISCAL REPAIR Right 10/2018  . TOTAL KNEE ARTHROPLASTY Left 05/08/2016   Procedure: LEFT TOTAL KNEE ARTHROPLASTY;  Surgeon: Eugenia Mcalpine, MD;  Location: [facility];  Service: Orthopedics;  Laterality: Left;  . TOTAL KNEE ARTHROPLASTY Right 05/12/2019   Procedure: TOTAL KNEE ARTHROPLASTY;  Surgeon: Eugenia Mcalpine, MD;  Location: [facility];  Service: Orthopedics;  Laterality: Right;  adductor canal    Current Medications: Current Meds  Medication Sig  . albuterol (VENTOLIN HFA) 108 (90 Base) MCG/ACT inhaler Take 2 puffs by mouth every 4 (four) hours as needed.  Marland Kitchen amLODipine (NORVASC) 5 MG tablet Take 5 mg by mouth every evening.   . Ascorbic Acid (VITAMIN C) 1000 MG tablet Take 1,000 mg by mouth daily.   . Calcium Carbonate-Vitamin D (CALTRATE 600+D PO) Take 1 tablet by mouth daily.   . Cholecalciferol (DIALYVITE VITAMIN D 5000 PO) Take 5,000 Units by mouth daily.   . Coenzyme Q10 (CO Q-10) 100 MG CAPS Take 100 mg by mouth daily.  Marland Kitchen estradiol (ESTRACE) 0.1 MG/GM vaginal cream Place 1 Applicatorful vaginally once a week.  . Melatonin 3 MG TABS Take 3 mg by mouth daily.  . metoprolol succinate (TOPROL-XL) 50  MG 24 hr tablet Take 50 mg by mouth daily. Take with or immediately following a meal.  . Multiple Vitamins-Minerals (MULTIVITAMIN WITH MINERALS) tablet Take 1 tablet by mouth daily.  Marland Kitchen omeprazole (PRILOSEC) 20 MG capsule Take 20 mg by mouth every evening.   . ondansetron (ZOFRAN) 4 MG tablet Take 1 tablet (4 mg total) by mouth daily as needed for nausea or vomiting.  Marland Kitchen oxyCODONE (ROXICODONE) 5 MG immediate release tablet Take 1 tablet (5 mg total) by mouth every 8 (eight) hours as needed.  . rosuvastatin (CRESTOR) 20 MG tablet Take 20 mg by mouth daily.  . vitamin B-12 (CYANOCOBALAMIN) 1000 MCG tablet Take 1,000 mcg by mouth daily.       Allergies:   Patient has no known allergies.   Social History   Socioeconomic History  . Marital status: Widowed    Spouse name: Not on file  . Number of children: Not on file  . Years of education: Not on file  . Highest education level: Not on file  Occupational History  . Not on file  Tobacco Use  . Smoking status: Never Smoker  . Smokeless tobacco: Never Used  Substance and Sexual Activity  . Alcohol use: Yes    Alcohol/week: 9.0 standard drinks    Types: 9 Glasses of wine per week  . Drug use: No  . Sexual activity: Not on file  Other Topics Concern  . Not on file  Social History Narrative  . Not on file   Social Determinants of Health   Financial Resource Strain:   . Difficulty of Paying Living Expenses: Not on file  Food Insecurity:   . Worried About Programme researcher, broadcasting/film/video in the Last Year: Not on file  . Ran Out of Food in the Last Year: Not on file  Transportation Needs:   . Lack of Transportation (Medical): Not on file  . Lack of Transportation (Non-Medical): Not on file  Physical Activity:   . Days of Exercise per Week: Not on file  . Minutes of Exercise per Session: Not on file  Stress:   . Feeling of Stress : Not on file  Social Connections:   . Frequency of Communication with Friends and Family: Not on file  . Frequency of Social Gatherings with Friends and Family: Not on file  . Attends Religious Services: Not on file  . Active Member of Clubs or Organizations: Not on file  . Attends Banker Meetings: Not on file  . Marital Status: Not on file     Family History: The patient's family history includes Colon cancer in her cousin; Colon polyps in her brother; Heart disease in her mother; Stomach cancer in her father. There is no history of Esophageal cancer, Rectal cancer, Pancreatic cancer, or Breast cancer.  ROS:   Please see the history of present illness.    Denies any fever chills diarrhea chest pain.  All other systems reviewed  and are negative.  EKGs/Labs/Other Studies Reviewed:    The following studies were reviewed today: ECHO NUC ECG   ECHO 10/12/17: - Left ventricle: The cavity size was normal. Wall thickness was   normal. Systolic function was normal. The estimated ejection   fraction was in the range of 55% to 60%. Wall motion was normal;   there were no regional wall motion abnormalities. - Pulmonary arteries: Systolic pressure was mildly increased. PA   peak pressure: 39 mm Hg (S).  NUC 10/14/17:  The left ventricular ejection fraction  is hyperdynamic (>65%).  Nuclear stress EF: 72%.  There was no ST segment deviation noted during stress.  This is a low risk study. No ischemia.   Donato Schultz, MD  EKG:  EKG is not ordered today.   Priors reviewd and NSR  Recent Labs: 05/09/2019: BUN 16; Creatinine, Ser 0.71; Hemoglobin 14.5; Platelets 281; Potassium 5.0; Sodium 142  Recent Lipid Panel    Component Value Date/Time   CHOL 160 10/11/2017 0850   TRIG 110 10/11/2017 0850   HDL 59 10/11/2017 0850   CHOLHDL 2.7 10/11/2017 0850   LDLCALC 79 10/11/2017 0850    Physical Exam:    VS:  BP 110/76   Pulse 86   Ht 5\' 6"  (1.676 m)   Wt 150 lb (68 kg)   SpO2 98%   BMI 24.21 kg/m     Wt Readings from Last 3 Encounters:  07/03/19 150 lb (68 kg)  05/12/19 152 lb 14.4 oz (69.4 kg)  05/09/19 152 lb 14.4 oz (69.4 kg)     GEN: Well nourished, well developed, in no acute distress  HEENT: normal  Neck: no JVD, carotid bruits, or masses Cardiac: RRR; no murmurs, rubs, or gallops,no edema rare ectopy Respiratory:  clear to auscultation bilaterally, normal work of breathing GI: soft, nontender, nondistended, + BS MS: no deformity or atrophy  Skin: warm and dry, no rash Neuro:  Alert and Oriented x 3, Strength and sensation are intact Psych: euthymic mood, full affect   ASSESSMENT:    1. Vaso vagal episode   2. Family history of early CAD   3. Pure hypercholesterolemia    PLAN:    In  order of problems listed above:  Near syncope-improved -From prior visit-sounds to me like she has some periodic autonomic dysfunction/vagal-like symptoms especially which can be associated with decrease in intravascular volume.  She does admit that she does not drink enough water or fluids throughout the day.  She has not had any high risk of red flag symptoms such as frank syncope.  Her flush-like sensation certainly could be a vagal type of reaction.  I would like to go ahead and treat her conservatively at this time with adequate hydration, support hose which she has used on airplane flights in the past.  Of course if things change or become more worrisome we can always consider monitoring her.  Resting heart rate is sometimes in the 90s on her Fitbit she may be prone to autonomic dysfunction.  Explained the physiology at length. -She is doing very well.  No further episodes.  Once again reiterated hydration and being careful.  Essential hypertension -Currently doing well, excellent control 110/76.  Amlodipine, Toprol.  Occasional ectopy heard on exam.  EKG from May 09, 2019 prior to her knee surgery was reviewed.  Sinus rhythm.  Prevention with strong family history of coronary artery disease -Continue with 20 mg of Crestor.  Excellent.  LDL 74 from outside labs in July 2020, ALT 15, creatinine 0.7.  Continue to monitor blood pressure.  Exercise daily.  Alcohol consumption in moderation.  Unfortunately lost her brother in July 2020.  Heart disease.   Medication Adjustments/Labs and Tests Ordered: Current medicines are reviewed at length with the patient today.  Concerns regarding medicines are outlined above.  No orders of the defined types were placed in this encounter.  No orders of the defined types were placed in this encounter.   Patient Instructions  Medication Instructions:  The current medical regimen is effective;  continue present plan and medications.  *If you need a  refill on your cardiac medications before your next appointment, please call your pharmacy*  Follow-Up: At [facility], you and your health needs are our priority.  As part of our continuing mission to provide you with exceptional heart care, we have created designated Provider Care Teams.  These Care Teams include your primary Cardiologist (physician) and Advanced Practice Providers (APPs -  Physician Assistants and Nurse Practitioners) who all work together to provide you with the care you need, when you need it.  Your next appointment:   12 month(s)  The format for your next appointment:   In Person  Provider:   Candee Furbish, MD  Thank you for choosing [facility]!!        Signed, Candee Furbish, MD  07/03/2019 1:47 PM    [place]

## 2019-07-03 NOTE — Patient Instructions (Signed)
Medication Instructions:  The current medical regimen is effective;  continue present plan and medications.  *If you need a refill on your cardiac medications before your next appointment, please call your pharmacy*  Follow-Up: At [facility], you and your health needs are our priority.  As part of our continuing mission to provide you with exceptional heart care, we have created designated Provider Care Teams.  These Care Teams include your primary Cardiologist (physician) and Advanced Practice Providers (APPs -  Physician Assistants and Nurse Practitioners) who all work together to provide you with the care you need, when you need it.  Your next appointment:   12 month(s)  The format for your next appointment:   In Person  Provider:   Mark Skains, MD   Thank you for choosing [facility]!!     

## 2019-08-07 DIAGNOSIS — Z96651 Presence of right artificial knee joint: Secondary | ICD-10-CM | POA: Diagnosis not present

## 2019-08-07 DIAGNOSIS — Z471 Aftercare following joint replacement surgery: Secondary | ICD-10-CM | POA: Diagnosis not present

## 2019-08-23 DIAGNOSIS — L659 Nonscarring hair loss, unspecified: Secondary | ICD-10-CM | POA: Diagnosis not present

## 2019-08-23 DIAGNOSIS — I10 Essential (primary) hypertension: Secondary | ICD-10-CM | POA: Diagnosis not present

## 2019-09-13 DIAGNOSIS — H25013 Cortical age-related cataract, bilateral: Secondary | ICD-10-CM | POA: Diagnosis not present

## 2019-09-21 DIAGNOSIS — Z0120 Encounter for dental examination and cleaning without abnormal findings: Secondary | ICD-10-CM | POA: Diagnosis not present

## 2019-10-04 DIAGNOSIS — I10 Essential (primary) hypertension: Secondary | ICD-10-CM | POA: Diagnosis not present

## 2019-11-06 DIAGNOSIS — Z471 Aftercare following joint replacement surgery: Secondary | ICD-10-CM | POA: Diagnosis not present

## 2019-11-06 DIAGNOSIS — Z96651 Presence of right artificial knee joint: Secondary | ICD-10-CM | POA: Diagnosis not present

## 2019-11-28 DIAGNOSIS — Z0000 Encounter for general adult medical examination without abnormal findings: Secondary | ICD-10-CM | POA: Diagnosis not present

## 2019-11-28 DIAGNOSIS — E7849 Other hyperlipidemia: Secondary | ICD-10-CM | POA: Diagnosis not present

## 2019-11-28 DIAGNOSIS — I10 Essential (primary) hypertension: Secondary | ICD-10-CM | POA: Diagnosis not present

## 2019-12-04 DIAGNOSIS — R82998 Other abnormal findings in urine: Secondary | ICD-10-CM | POA: Diagnosis not present

## 2019-12-04 DIAGNOSIS — Z1212 Encounter for screening for malignant neoplasm of rectum: Secondary | ICD-10-CM | POA: Diagnosis not present

## 2019-12-04 DIAGNOSIS — I10 Essential (primary) hypertension: Secondary | ICD-10-CM | POA: Diagnosis not present

## 2019-12-25 ENCOUNTER — Telehealth: Payer: Self-pay | Admitting: Cardiology

## 2019-12-25 NOTE — Telephone Encounter (Signed)
  Patient is calling because she is having some coughing and an ache down her left arm. She said before her arm only hurt when she was being active but she says that yesterday it hurt when she wasn't active. She has an appt with Dr Anne Fu on 01/02/20 but would like to speak with a nurse since there was no earlier appt available.

## 2019-12-25 NOTE — Telephone Encounter (Signed)
Agree with plan Gene Glazebrook, MD  

## 2019-12-25 NOTE — Telephone Encounter (Signed)
Feeling apprehensive.  Left arm pain started at least 3 weeks ago.  Left arm would start aching only when walking on incline.  Pain progressed to when she is walking on flat surface.  Now it is occurring when not walking.  Noticing after she is up moving around during the day.   Not keeping up or waking her at night.  Nausea every once in a while.  Saturday am walked.  Arm ached.  Rested.  Rest of the day felt "yuk" shoulder bothering her.  Took advil which did not help arm pain.  Is not worse upon movement or pressing on the arm.   [place] and ankles and legs swelled.  This resolved every am.  She was concerned it was from the Crestor so she stopped it.  Swelling continued so she went back on rosuvastatin.  States her PCP called on 7/12 to get her a cardiology appointment.  It was scheduled for next week w Dr. Anne Fu.  I have moved her appointment up to tomorrow with Tereso Newcomer, PA-C. Adv to call EMS to go to ER for urgent eval if left arm pain is accompanied by any other symptoms.   Adv I will forward to Dr. Anne Fu and if there are new recommendations I will call her back.

## 2019-12-26 ENCOUNTER — Encounter: Payer: Self-pay | Admitting: Physician Assistant

## 2019-12-26 ENCOUNTER — Other Ambulatory Visit: Payer: Self-pay

## 2019-12-26 ENCOUNTER — Ambulatory Visit: Payer: Medicare Other | Admitting: Physician Assistant

## 2019-12-26 VITALS — BP 102/62 | HR 71 | Ht 66.0 in | Wt 149.0 lb

## 2019-12-26 DIAGNOSIS — I200 Unstable angina: Secondary | ICD-10-CM

## 2019-12-26 DIAGNOSIS — E7800 Pure hypercholesterolemia, unspecified: Secondary | ICD-10-CM | POA: Diagnosis not present

## 2019-12-26 DIAGNOSIS — I10 Essential (primary) hypertension: Secondary | ICD-10-CM | POA: Diagnosis not present

## 2019-12-26 MED ORDER — NITROGLYCERIN 0.4 MG SL SUBL: 0.4000 mg | SUBLINGUAL_TABLET | SUBLINGUAL | 6 refills | Status: DC | PRN

## 2019-12-26 NOTE — H&P (View-Only) (Signed)
Cardiology Office Note:    Date:  12/26/2019   ID:  Candice Richardson, DOB 18-Mar-1946, MRN [id number]  PCP:  Jarome Matin, MD  Cardiologist:  Donato Schultz, MD  Electrophysiologist:  None   Referring MD: Jarome Matin, MD   Chief Complaint:  Exertional Arm Pain    Patient Profile:    Candice Richardson is a 74 y.o. female with:   Hypertension   Hyperlipidemia   GERD  DJD, s/p L TKR in 12/17 and R TKR in 04/2019  FHx of CAD  Prior CV studies: Myoview 10/14/2017 EF 72, no ischemia, low risk  Echocardiogram 10/12/2017 EF 55-60, normal wall motion, PASP 39  Myoview 11/14/2013 EF 69, no ischemia, low risk  ETT 10/18/2013 2 to 3 mm ST depression with 9 minutes of exercise  Echocardiogram 10/19/2013 Mild focal basal septal hypertrophy, EF 55-60, normal wall motion, mild MR, mild TR, PASP 37  History of Present Illness:    Candice Richardson was last seen by Dr. Anne Fu in February 2021.  She called in yesterday with symptoms of exertional L arm pain that has progressively worsened. She is seen for further evaluation.   She is here alone.  She has noted progressively worsening exertional left arm discomfort over the past 6 weeks.  Her symptoms did resolve with rest.  However, she seems to have more constant symptoms now.  She has not really been short of breath.  She has been nauseated.  She has not had diaphoresis or syncope.  She has not had orthopnea or lower extremity swelling.  Past Medical History:  Diagnosis Date  . Arthritis   . Diverticulitis   . GERD (gastroesophageal reflux disease)   . Heart murmur   . Hyperlipidemia   . Hypertension   . Torn meniscus     Current Medications: Current Meds  Medication Sig  . amLODipine (NORVASC) 5 MG tablet Take 5 mg by mouth every evening.   . Ascorbic Acid (VITAMIN C) 1000 MG tablet Take 1,000 mg by mouth daily.   Marland Kitchen aspirin EC 81 MG tablet Take 81 mg by mouth daily. Swallow whole.  . Calcium Carbonate-Vitamin D (CALTRATE 600+D  PO) Take 1 tablet by mouth daily.   . Cholecalciferol (DIALYVITE VITAMIN D 5000 PO) Take 5,000 Units by mouth daily.   . Coenzyme Q10 (CO Q-10) 100 MG CAPS Take 100 mg by mouth daily.  Marland Kitchen estradiol (ESTRACE) 0.1 MG/GM vaginal cream Place 1 Applicatorful vaginally once a week.  . ezetimibe (ZETIA) 10 MG tablet Take 10 mg by mouth daily.  . Melatonin 3 MG TABS Take 3 mg by mouth as needed (for sleep).   . metoprolol succinate (TOPROL-XL) 50 MG 24 hr tablet Take 50 mg by mouth daily. Take with or immediately following a meal.  . Multiple Vitamins-Minerals (MULTIVITAMIN WITH MINERALS) tablet Take 1 tablet by mouth daily.  Marland Kitchen omeprazole (PRILOSEC) 20 MG capsule Take 20 mg by mouth every evening.   . rosuvastatin (CRESTOR) 20 MG tablet Take 20 mg by mouth daily.  . vitamin B-12 (CYANOCOBALAMIN) 1000 MCG tablet Take 1,000 mcg by mouth daily.      Allergies:   Patient has no known allergies.   Social History   Tobacco Use  . Smoking status: Never Smoker  . Smokeless tobacco: Never Used  Vaping Use  . Vaping Use: Never used  Substance Use Topics  . Alcohol use: Yes    Alcohol/week: 9.0 standard drinks    Types: 9 Glasses of wine per week  .  Drug use: No     Family Hx: The patient's family history includes Colon cancer in her cousin; Colon polyps in her brother; Heart disease in her mother; Stomach cancer in her father. There is no history of Esophageal cancer, Rectal cancer, Pancreatic cancer, or Breast cancer.  Review of Systems  Constitutional: Negative for chills and fever.  Respiratory: Positive for cough (non-productive).   Skin: Negative for rash.  Gastrointestinal: Negative for hematochezia and melena.  Genitourinary: Negative for hematuria.  All other systems reviewed and are negative.    EKGs/Labs/Other Test Reviewed:    EKG:  EKG is  ordered today.  The ekg ordered today demonstrates normal sinus rhythm, heart rate 71, normal axis, no ST-T wave changes, QTC 436, no change  from prior tracing  Recent Labs: 05/09/2019: BUN 16; Creatinine, Ser 0.71; Hemoglobin 14.5; Platelets 281; Potassium 5.0; Sodium 142   Recent Lipid Panel Lab Results  Component Value Date/Time   CHOL 160 10/11/2017 08:50 AM   TRIG 110 10/11/2017 08:50 AM   HDL 59 10/11/2017 08:50 AM   CHOLHDL 2.7 10/11/2017 08:50 AM   LDLCALC 79 10/11/2017 08:50 AM    Physical Exam:    VS:  BP 102/62   Pulse 71   Ht 5\' 6"  (1.676 m)   Wt 149 lb (67.6 kg)   SpO2 97%   BMI 24.05 kg/m     Wt Readings from Last 3 Encounters:  12/26/19 149 lb (67.6 kg)  07/03/19 150 lb (68 kg)  05/12/19 152 lb 14.4 oz (69.4 kg)     Constitutional:      Appearance: Healthy appearance. Not in distress.  Neck:     Thyroid: No thyromegaly.     Vascular: JVD normal.     Lymphadenopathy: No cervical adenopathy.  Pulmonary:     Effort: Pulmonary effort is normal.     Breath sounds: No wheezing. No rales.  Cardiovascular:     Normal rate. Regular rhythm. Normal S1. Normal S2.     Murmurs: There is no murmur.  Edema:    Peripheral edema absent.  Abdominal:     Palpations: Abdomen is soft. There is no hepatomegaly.  Skin:    General: Skin is warm and dry.  Neurological:     Mental Status: Alert and oriented to person, place and time.     Cranial Nerves: Cranial nerves are intact.  Psychiatric:        Mood and Affect: Affect normal.      ASSESSMENT & PLAN:    1. Unstable angina [facility]) She has a significant family history of coronary artery disease.  She also has hypertension and hyperlipidemia.  She now presents with progressively worsening exertional left arm discomfort.  This seems like an anginal equivalent.  Her ECG does not demonstrate any acute findings.  We discussed the pros and cons of proceeding with coronary CTA versus cardiac catheterization.  I have recommended cardiac catheterization as her symptoms seem consistent with angina.  I reviewed her case with Dr. [facility], who agreed.  Risks and  benefits of cardiac catheterization have been discussed with the patient.  These include bleeding, infection, kidney damage, stroke, heart attack, death.  The patient understands these risks and is willing to proceed.   -Arrange cardiac catheterization as soon as possible  -Continue statin, ASA  -Rx for prn NTG  -BMET, CBC today  -Arrange COVID-19 test   -She knows to go to the ED if her symptoms change/worsen  2. Essential hypertension The patient's  blood pressure is controlled on her current regimen.  Continue current therapy.    3. Pure hypercholesterolemia Continue Rosuvastatin, Ezetimibe    Dispo:  Return in about 2 weeks (around 01/09/2020) for Post Procedure Follow Up w/ Dr. Anne Fu, or Tereso Newcomer, PA-C, in person.   Medication Adjustments/Labs and Tests Ordered: Current medicines are reviewed at length with the patient today.  Concerns regarding medicines are outlined above.  Tests Ordered: Orders Placed This Encounter  Procedures  . Basic metabolic panel  . CBC  . EKG 12-Lead   Medication Changes: Meds ordered this encounter  Medications  . nitroGLYCERIN (NITROSTAT) 0.4 MG SL tablet    Sig: Place 1 tablet (0.4 mg total) under the tongue every 5 (five) minutes as needed for chest pain.    Dispense:  30 tablet    Refill:  6    Signed, Tereso Newcomer, PA-C  12/26/2019 5:03 PM    [facility] [address] [place], [place], [place]  [address] Phone: [phone]; Fax: [phone]

## 2019-12-26 NOTE — Patient Instructions (Signed)
Medication Instructions:  Your physician has recommended you make the following change in your medication:   1) Start Nitroglycerin sublingual (under the tongue) 1 tablet as needed for chest pain  *If you need a refill on your cardiac medications before your next appointment, please call your pharmacy*  Lab Work: You will have labs drawn today: CBC/BMET  Testing/Procedures: Your physician has requested that you have a cardiac catheterization. Cardiac catheterization is used to diagnose and/or treat various heart conditions. Doctors may recommend this procedure for a number of different reasons. The most common reason is to evaluate chest pain. Chest pain can be a symptom of coronary artery disease (CAD), and cardiac catheterization can show whether plaque is narrowing or blocking your heart's arteries. This procedure is also used to evaluate the valves, as well as measure the blood flow and oxygen levels in different parts of your heart. For further information please visit [email or web address] Please follow instruction sheet, as given.  Your Pre-procedure COVID-19 Testing will be done on  at [address] [place], [place] [address]. After your swab you will be given a mask to wear and instructed to go home and quarantine you are not allowed to have visitors until after your procedure. If you test positive you will be notified and your procedure will be cancelled.   Follow-Up: On 01/16/20 at 11:15AM with Tereso Newcomer, PA-C  Other Instructions If your symptoms get worse, go to the emergency room.

## 2019-12-26 NOTE — Progress Notes (Signed)
Cardiology Office Note:    Date:  12/26/2019   ID:  Deyona Soza, DOB 18-Mar-1946, MRN [id number]  PCP:  Jarome Matin, MD  Cardiologist:  Donato Schultz, MD  Electrophysiologist:  None   Referring MD: Jarome Matin, MD   Chief Complaint:  Exertional Arm Pain    Patient Profile:    Jose Alleyne is a 74 y.o. female with:   Hypertension   Hyperlipidemia   GERD  DJD, s/p L TKR in 12/17 and R TKR in 04/2019  FHx of CAD  Prior CV studies: Myoview 10/14/2017 EF 72, no ischemia, low risk  Echocardiogram 10/12/2017 EF 55-60, normal wall motion, PASP 39  Myoview 11/14/2013 EF 69, no ischemia, low risk  ETT 10/18/2013 2 to 3 mm ST depression with 9 minutes of exercise  Echocardiogram 10/19/2013 Mild focal basal septal hypertrophy, EF 55-60, normal wall motion, mild MR, mild TR, PASP 37  History of Present Illness:    Ms. Lufkin was last seen by Dr. Anne Fu in February 2021.  She called in yesterday with symptoms of exertional L arm pain that has progressively worsened. She is seen for further evaluation.   She is here alone.  She has noted progressively worsening exertional left arm discomfort over the past 6 weeks.  Her symptoms did resolve with rest.  However, she seems to have more constant symptoms now.  She has not really been short of breath.  She has been nauseated.  She has not had diaphoresis or syncope.  She has not had orthopnea or lower extremity swelling.  Past Medical History:  Diagnosis Date  . Arthritis   . Diverticulitis   . GERD (gastroesophageal reflux disease)   . Heart murmur   . Hyperlipidemia   . Hypertension   . Torn meniscus     Current Medications: Current Meds  Medication Sig  . amLODipine (NORVASC) 5 MG tablet Take 5 mg by mouth every evening.   . Ascorbic Acid (VITAMIN C) 1000 MG tablet Take 1,000 mg by mouth daily.   Marland Kitchen aspirin EC 81 MG tablet Take 81 mg by mouth daily. Swallow whole.  . Calcium Carbonate-Vitamin D (CALTRATE 600+D  PO) Take 1 tablet by mouth daily.   . Cholecalciferol (DIALYVITE VITAMIN D 5000 PO) Take 5,000 Units by mouth daily.   . Coenzyme Q10 (CO Q-10) 100 MG CAPS Take 100 mg by mouth daily.  Marland Kitchen estradiol (ESTRACE) 0.1 MG/GM vaginal cream Place 1 Applicatorful vaginally once a week.  . ezetimibe (ZETIA) 10 MG tablet Take 10 mg by mouth daily.  . Melatonin 3 MG TABS Take 3 mg by mouth as needed (for sleep).   . metoprolol succinate (TOPROL-XL) 50 MG 24 hr tablet Take 50 mg by mouth daily. Take with or immediately following a meal.  . Multiple Vitamins-Minerals (MULTIVITAMIN WITH MINERALS) tablet Take 1 tablet by mouth daily.  Marland Kitchen omeprazole (PRILOSEC) 20 MG capsule Take 20 mg by mouth every evening.   . rosuvastatin (CRESTOR) 20 MG tablet Take 20 mg by mouth daily.  . vitamin B-12 (CYANOCOBALAMIN) 1000 MCG tablet Take 1,000 mcg by mouth daily.      Allergies:   Patient has no known allergies.   Social History   Tobacco Use  . Smoking status: Never Smoker  . Smokeless tobacco: Never Used  Vaping Use  . Vaping Use: Never used  Substance Use Topics  . Alcohol use: Yes    Alcohol/week: 9.0 standard drinks    Types: 9 Glasses of wine per week  .  Drug use: No     Family Hx: The patient's family history includes Colon cancer in her cousin; Colon polyps in her brother; Heart disease in her mother; Stomach cancer in her father. There is no history of Esophageal cancer, Rectal cancer, Pancreatic cancer, or Breast cancer.  Review of Systems  Constitutional: Negative for chills and fever.  Respiratory: Positive for cough (non-productive).   Skin: Negative for rash.  Gastrointestinal: Negative for hematochezia and melena.  Genitourinary: Negative for hematuria.  All other systems reviewed and are negative.    EKGs/Labs/Other Test Reviewed:    EKG:  EKG is  ordered today.  The ekg ordered today demonstrates normal sinus rhythm, heart rate 71, normal axis, no ST-T wave changes, QTC 436, no change  from prior tracing  Recent Labs: 05/09/2019: BUN 16; Creatinine, Ser 0.71; Hemoglobin 14.5; Platelets 281; Potassium 5.0; Sodium 142   Recent Lipid Panel Lab Results  Component Value Date/Time   CHOL 160 10/11/2017 08:50 AM   TRIG 110 10/11/2017 08:50 AM   HDL 59 10/11/2017 08:50 AM   CHOLHDL 2.7 10/11/2017 08:50 AM   LDLCALC 79 10/11/2017 08:50 AM    Physical Exam:    VS:  BP 102/62   Pulse 71   Ht 5' 6" (1.676 m)   Wt 149 lb (67.6 kg)   SpO2 97%   BMI 24.05 kg/m     Wt Readings from Last 3 Encounters:  12/26/19 149 lb (67.6 kg)  07/03/19 150 lb (68 kg)  05/12/19 152 lb 14.4 oz (69.4 kg)     Constitutional:      Appearance: Healthy appearance. Not in distress.  Neck:     Thyroid: No thyromegaly.     Vascular: JVD normal.     Lymphadenopathy: No cervical adenopathy.  Pulmonary:     Effort: Pulmonary effort is normal.     Breath sounds: No wheezing. No rales.  Cardiovascular:     Normal rate. Regular rhythm. Normal S1. Normal S2.     Murmurs: There is no murmur.  Edema:    Peripheral edema absent.  Abdominal:     Palpations: Abdomen is soft. There is no hepatomegaly.  Skin:    General: Skin is warm and dry.  Neurological:     Mental Status: Alert and oriented to person, place and time.     Cranial Nerves: Cranial nerves are intact.  Psychiatric:        Mood and Affect: Affect normal.      ASSESSMENT & PLAN:    1. Unstable angina (HCC) She has a significant family history of coronary artery disease.  She also has hypertension and hyperlipidemia.  She now presents with progressively worsening exertional left arm discomfort.  This seems like an anginal equivalent.  Her ECG does not demonstrate any acute findings.  We discussed the pros and cons of proceeding with coronary CTA versus cardiac catheterization.  I have recommended cardiac catheterization as her symptoms seem consistent with angina.  I reviewed her case with Dr. Skains, who agreed.  Risks and  benefits of cardiac catheterization have been discussed with the patient.  These include bleeding, infection, kidney damage, stroke, heart attack, death.  The patient understands these risks and is willing to proceed.   -Arrange cardiac catheterization as soon as possible  -Continue statin, ASA  -Rx for prn NTG  -BMET, CBC today  -Arrange COVID-19 test   -She knows to go to the ED if her symptoms change/worsen  2. Essential hypertension The patient's   blood pressure is controlled on her current regimen.  Continue current therapy.    3. Pure hypercholesterolemia Continue Rosuvastatin, Ezetimibe    Dispo:  Return in about 2 weeks (around 01/09/2020) for Post Procedure Follow Up w/ Dr. Anne Fu, or Tereso Newcomer, PA-C, in person.   Medication Adjustments/Labs and Tests Ordered: Current medicines are reviewed at length with the patient today.  Concerns regarding medicines are outlined above.  Tests Ordered: Orders Placed This Encounter  Procedures  . Basic metabolic panel  . CBC  . EKG 12-Lead   Medication Changes: Meds ordered this encounter  Medications  . nitroGLYCERIN (NITROSTAT) 0.4 MG SL tablet    Sig: Place 1 tablet (0.4 mg total) under the tongue every 5 (five) minutes as needed for chest pain.    Dispense:  30 tablet    Refill:  6    Signed, Tereso Newcomer, PA-C  12/26/2019 5:03 PM    [facility] [address] [place], [place], [place]  [address] Phone: [phone]; Fax: [phone]

## 2019-12-27 ENCOUNTER — Other Ambulatory Visit (HOSPITAL_COMMUNITY)
Admission: RE | Admit: 2019-12-27 | Discharge: 2019-12-27 | Disposition: A | Discharge status: Home / Self Care | Payer: Medicare Other | Source: Ambulatory Visit | Attending: Internal Medicine | Admitting: Internal Medicine

## 2019-12-27 DIAGNOSIS — Z20822 Contact with and (suspected) exposure to covid-19: Secondary | ICD-10-CM | POA: Insufficient documentation

## 2019-12-27 DIAGNOSIS — Z01812 Encounter for preprocedural laboratory examination: Secondary | ICD-10-CM | POA: Insufficient documentation

## 2019-12-27 LAB — BASIC METABOLIC PANEL
BUN/Creatinine Ratio: 17 (ref 12–28)
BUN: 16 mg/dL (ref 8–27)
CO2: 23 mmol/L (ref 20–29)
Calcium: 10 mg/dL (ref 8.7–10.3)
Chloride: 100 mmol/L (ref 96–106)
Creatinine, Ser: 0.95 mg/dL (ref 0.57–1.00)
GFR calc Af Amer: 67 mL/min/{1.73_m2} (ref >59)
GFR calc non Af Amer: 58 mL/min/{1.73_m2} — ABNORMAL LOW (ref >59)
Glucose: 91 mg/dL (ref 65–99)
Potassium: 4.8 mmol/L (ref 3.5–5.2)
Sodium: 144 mmol/L (ref 134–144)

## 2019-12-27 LAB — CBC
Hematocrit: 46.8 % — ABNORMAL HIGH (ref 34.0–46.6)
Hemoglobin: 15.9 g/dL (ref 11.1–15.9)
MCH: 31 pg (ref 26.6–33.0)
MCHC: 34 g/dL (ref 31.5–35.7)
MCV: 91 fL (ref 79–97)
Platelets: 309 10*3/uL (ref 150–450)
RBC: 5.13 x10E6/uL (ref 3.77–5.28)
RDW: 12.8 % (ref 11.7–15.4)
WBC: 9.9 10*3/uL (ref 3.4–10.8)

## 2019-12-27 LAB — SARS CORONAVIRUS 2 (TAT 6-24 HRS): SARS Coronavirus 2: NEGATIVE

## 2019-12-28 ENCOUNTER — Encounter (HOSPITAL_COMMUNITY)
Admission: RE | Disposition: A | Discharge status: Home / Self Care | Payer: Self-pay | Source: Home / Self Care | Attending: Internal Medicine

## 2019-12-28 ENCOUNTER — Encounter (HOSPITAL_COMMUNITY): Payer: Self-pay | Admitting: Internal Medicine

## 2019-12-28 ENCOUNTER — Ambulatory Visit (HOSPITAL_COMMUNITY)
Admission: RE | Admit: 2019-12-28 | Discharge: 2019-12-28 | Disposition: A | Discharge status: Home / Self Care | Payer: Medicare Other | Attending: Internal Medicine | Admitting: Internal Medicine

## 2019-12-28 ENCOUNTER — Other Ambulatory Visit: Payer: Self-pay

## 2019-12-28 DIAGNOSIS — I10 Essential (primary) hypertension: Secondary | ICD-10-CM | POA: Diagnosis not present

## 2019-12-28 DIAGNOSIS — Z79899 Other long term (current) drug therapy: Secondary | ICD-10-CM | POA: Insufficient documentation

## 2019-12-28 DIAGNOSIS — Z7982 Long term (current) use of aspirin: Secondary | ICD-10-CM | POA: Insufficient documentation

## 2019-12-28 DIAGNOSIS — Z8249 Family history of ischemic heart disease and other diseases of the circulatory system: Secondary | ICD-10-CM | POA: Insufficient documentation

## 2019-12-28 DIAGNOSIS — E785 Hyperlipidemia, unspecified: Secondary | ICD-10-CM | POA: Insufficient documentation

## 2019-12-28 DIAGNOSIS — I25110 Atherosclerotic heart disease of native coronary artery with unstable angina pectoris: Secondary | ICD-10-CM

## 2019-12-28 DIAGNOSIS — E7800 Pure hypercholesterolemia, unspecified: Secondary | ICD-10-CM | POA: Insufficient documentation

## 2019-12-28 DIAGNOSIS — K219 Gastro-esophageal reflux disease without esophagitis: Secondary | ICD-10-CM | POA: Insufficient documentation

## 2019-12-28 DIAGNOSIS — I200 Unstable angina: Secondary | ICD-10-CM | POA: Diagnosis present

## 2019-12-28 HISTORY — PX: LEFT HEART CATH AND CORONARY ANGIOGRAPHY: CATH118249

## 2019-12-28 HISTORY — PX: INTRAVASCULAR PRESSURE WIRE/FFR STUDY: CATH118243

## 2019-12-28 LAB — POCT ACTIVATED CLOTTING TIME
Activated Clotting Time: 263 seconds
Activated Clotting Time: 268 seconds

## 2019-12-28 SURGERY — LEFT HEART CATH AND CORONARY ANGIOGRAPHY
Anesthesia: LOCAL

## 2019-12-28 MED ORDER — ASPIRIN 81 MG PO CHEW: 81.0000 mg | CHEWABLE_TABLET | Freq: Every day | ORAL | Status: DC

## 2019-12-28 MED ORDER — ISOSORBIDE MONONITRATE ER 30 MG PO TB24
15.0000 mg | ORAL_TABLET | Freq: Every day | ORAL | 5 refills | Status: DC
Start: 2019-12-28 — End: 2020-01-08

## 2019-12-28 MED ORDER — ASPIRIN 81 MG PO CHEW: 81.0000 mg | CHEWABLE_TABLET | ORAL | Status: DC

## 2019-12-28 MED ORDER — NITROGLYCERIN 1 MG/10 ML FOR IR/CATH LAB
INTRA_ARTERIAL | Status: AC
  Filled 2019-12-28: qty 10

## 2019-12-28 MED ORDER — FENTANYL CITRATE (PF) 100 MCG/2ML IJ SOLN
INTRAMUSCULAR | Status: AC
  Filled 2019-12-28: qty 2

## 2019-12-28 MED ORDER — HYDRALAZINE HCL 20 MG/ML IJ SOLN: 10.0000 mg | INTRAMUSCULAR | Status: DC | PRN

## 2019-12-28 MED ORDER — SODIUM CHLORIDE 0.9% FLUSH: 3.0000 mL | Freq: Two times a day (BID) | INTRAVENOUS | Status: DC

## 2019-12-28 MED ORDER — LIDOCAINE HCL (PF) 1 % IJ SOLN
INTRAMUSCULAR | Status: DC | PRN
  Administered 2019-12-28: 3 mL

## 2019-12-28 MED ORDER — HEPARIN SODIUM (PORCINE) 1000 UNIT/ML IJ SOLN
INTRAMUSCULAR | Status: AC
  Filled 2019-12-28: qty 1

## 2019-12-28 MED ORDER — HEPARIN (PORCINE) IN NACL 1000-0.9 UT/500ML-% IV SOLN
INTRAVENOUS | Status: DC | PRN
  Administered 2019-12-28: 500 mL

## 2019-12-28 MED ORDER — LABETALOL HCL 5 MG/ML IV SOLN: 10.0000 mg | INTRAVENOUS | Status: DC | PRN

## 2019-12-28 MED ORDER — NITROGLYCERIN 0.4 MG SL SUBL
SUBLINGUAL_TABLET | SUBLINGUAL | Status: AC
  Filled 2019-12-28: qty 1

## 2019-12-28 MED ORDER — SODIUM CHLORIDE 0.9% FLUSH: 3.0000 mL | INTRAVENOUS | Status: DC | PRN

## 2019-12-28 MED ORDER — VERAPAMIL HCL 2.5 MG/ML IV SOLN
INTRAVENOUS | Status: DC | PRN
  Administered 2019-12-28: 10 mL via INTRA_ARTERIAL

## 2019-12-28 MED ORDER — ACETAMINOPHEN 325 MG PO TABS: 650.0000 mg | ORAL_TABLET | ORAL | Status: DC | PRN

## 2019-12-28 MED ORDER — VERAPAMIL HCL 2.5 MG/ML IV SOLN
INTRAVENOUS | Status: AC
  Filled 2019-12-28: qty 2

## 2019-12-28 MED ORDER — FENTANYL CITRATE (PF) 100 MCG/2ML IJ SOLN
INTRAMUSCULAR | Status: DC | PRN
  Administered 2019-12-28 (×2): 25 ug via INTRAVENOUS

## 2019-12-28 MED ORDER — LIDOCAINE HCL (PF) 1 % IJ SOLN
INTRAMUSCULAR | Status: AC
  Filled 2019-12-28: qty 30

## 2019-12-28 MED ORDER — SODIUM CHLORIDE 0.9 % WEIGHT BASED INFUSION
1.0000 mL/kg/h | INTRAVENOUS | Status: DC
  Administered 2019-12-28: 250 mL via INTRAVENOUS

## 2019-12-28 MED ORDER — SODIUM CHLORIDE 0.9 % IV SOLN: INTRAVENOUS | Status: DC

## 2019-12-28 MED ORDER — ONDANSETRON HCL 4 MG/2ML IJ SOLN: 4.0000 mg | Freq: Four times a day (QID) | INTRAMUSCULAR | Status: DC | PRN

## 2019-12-28 MED ORDER — IOHEXOL 350 MG/ML SOLN
INTRAVENOUS | Status: DC | PRN
  Administered 2019-12-28: 100 mL

## 2019-12-28 MED ORDER — SODIUM CHLORIDE 0.9 % IV SOLN: 250.0000 mL | INTRAVENOUS | Status: DC | PRN

## 2019-12-28 MED ORDER — HEPARIN SODIUM (PORCINE) 1000 UNIT/ML IJ SOLN
INTRAMUSCULAR | Status: DC | PRN
  Administered 2019-12-28 (×2): 3500 [IU] via INTRAVENOUS
  Administered 2019-12-28: 2000 [IU] via INTRAVENOUS

## 2019-12-28 MED ORDER — SODIUM CHLORIDE 0.9 % WEIGHT BASED INFUSION
3.0000 mL/kg/h | INTRAVENOUS | Status: AC
  Administered 2019-12-28: 3 mL/kg/h via INTRAVENOUS

## 2019-12-28 MED ORDER — MIDAZOLAM HCL 2 MG/2ML IJ SOLN
INTRAMUSCULAR | Status: AC
  Filled 2019-12-28: qty 2

## 2019-12-28 MED ORDER — MIDAZOLAM HCL 2 MG/2ML IJ SOLN
INTRAMUSCULAR | Status: DC | PRN
  Administered 2019-12-28 (×2): 1 mg via INTRAVENOUS

## 2019-12-28 MED ORDER — NITROGLYCERIN 1 MG/10 ML FOR IR/CATH LAB
INTRA_ARTERIAL | Status: DC | PRN
  Administered 2019-12-28 (×2): 200 ug via INTRACORONARY

## 2019-12-28 MED ORDER — HEPARIN (PORCINE) IN NACL 1000-0.9 UT/500ML-% IV SOLN
INTRAVENOUS | Status: AC
  Filled 2019-12-28: qty 1000

## 2019-12-28 SURGICAL SUPPLY — 16 items
CATH 5FR JL3.5 JR4 ANG PIG MP (CATHETERS) ×2 IMPLANT
CATH LAUNCHER 6FR EBU3.5 (CATHETERS) ×2 IMPLANT
CATH VISTA GUIDE 6FR JR4 (CATHETERS) ×2 IMPLANT
DEVICE RAD COMP TR BAND LRG (VASCULAR PRODUCTS) ×2 IMPLANT
GLIDESHEATH SLEND A-KIT 6F 22G (SHEATH) ×2 IMPLANT
GUIDEWIRE INQWIRE 1.5J.035X260 (WIRE) ×1 IMPLANT
GUIDEWIRE PRESSURE COMET II (WIRE) ×2 IMPLANT
INQWIRE 1.5J .035X260CM (WIRE) ×2
KIT ENCORE 26 ADVANTAGE (KITS) ×2 IMPLANT
KIT HEART LEFT (KITS) ×2 IMPLANT
NEEDLE PERC 18GX4CM (NEEDLE) IMPLANT
PACK CARDIAC CATHETERIZATION (CUSTOM PROCEDURE TRAY) ×2 IMPLANT
SYR MEDRAD MARK 7 150ML (SYRINGE) ×2 IMPLANT
TRANSDUCER W/STOPCOCK (MISCELLANEOUS) ×2 IMPLANT
TUBING CIL FLEX 10 FLL-RA (TUBING) ×2 IMPLANT
WIRE HI TORQ VERSACORE-J 145CM (WIRE) ×2 IMPLANT

## 2019-12-28 NOTE — Interval H&P Note (Signed)
History and Physical Interval Note:  12/28/2019 6:58 AM  Candice Richardson  has presented today for surgery, with the diagnosis of unstable angina.  The various methods of treatment have been discussed with the patient and family. After consideration of risks, benefits and other options for treatment, the patient has consented to  Procedure(s): LEFT HEART CATH AND CORONARY ANGIOGRAPHY (N/A) as a surgical intervention.  The patient's history has been reviewed, patient examined, no change in status, stable for surgery.  I have reviewed the patient's chart and labs.  Questions were answered to the patient's satisfaction.    Cath Lab Visit (complete for each Cath Lab visit)  Clinical Evaluation Leading to the Procedure:   ACS: Yes.    Non-ACS:    Anginal Classification: CCS IV  Anti-ischemic medical therapy: Maximal Therapy (2 or more classes of medications)  Non-Invasive Test Results: No non-invasive testing performed  Prior CABG: No previous CABG  Candice Richardson

## 2019-12-28 NOTE — Discharge Instructions (Signed)
Drink plenty of fluid for 48 hours and keep wrist elevated at heart level for 24 hours  Radial Site Care   This sheet gives you information about how to care for yourself after your procedure. Your health care provider may also give you more specific instructions. If you have problems or questions, contact your health care provider. What can I expect after the procedure? After the procedure, it is common to have:  Bruising and tenderness at the catheter insertion area. Follow these instructions at home: Medicines  Take over-the-counter and prescription medicines only as told by your health care provider. Insertion site care 1. Follow instructions from your health care provider about how to take care of your insertion site. Make sure you: ? Wash your hands with soap and water before you change your bandage (dressing). If soap and water are not available, use hand sanitizer. ? remove your dressing as told by your health care provider. In 24 hours 2. Check your insertion site every day for signs of infection. Check for: ? Redness, swelling, or pain. ? Fluid or blood. ? Pus or a bad smell. ? Warmth. 3. Do not take baths, swim, or use a hot tub until your health care provider approves. 4. You may shower 24-48 hours after the procedure, or as directed by your health care provider. ? Remove the dressing and gently wash the site with plain soap and water. ? Pat the area dry with a clean towel. ? Do not rub the site. That could cause bleeding. 5. Do not apply powder or lotion to the site. Activity   1. For 24 hours after the procedure, or as directed by your health care provider: ? Do not flex or bend the affected arm. ? Do not push or pull heavy objects with the affected arm. ? Do not drive yourself home from the hospital or clinic. You may drive 24 hours after the procedure unless your health care provider tells you not to. ? Do not operate machinery or power tools. 2. Do not lift  anything that is heavier than 10 lb (4.5 kg), or the limit that you are told, until your health care provider says that it is safe. For 4 days 3. Ask your health care provider when it is okay to: ? Return to work or school. ? Resume usual physical activities or sports. ? Resume sexual activity. General instructions  If the catheter site starts to bleed, raise your arm and put firm pressure on the site. If the bleeding does not stop, get help right away. This is a medical emergency.  If you went home on the same day as your procedure, a responsible adult should be with you for the first 24 hours after you arrive home.  Keep all follow-up visits as told by your health care provider. This is important. Contact a health care provider if:  You have a fever.  You have redness, swelling, or yellow drainage around your insertion site. Get help right away if:  You have unusual pain at the radial site.  The catheter insertion area swells very fast.  The insertion area is bleeding, and the bleeding does not stop when you hold steady pressure on the area.  Your arm or hand becomes pale, cool, tingly, or numb. These symptoms may represent a serious problem that is an emergency. Do not wait to see if the symptoms will go away. Get medical help right away. Call your local emergency services (911 in the [place] Do not   drive yourself to the hospital. Summary  After the procedure, it is common to have bruising and tenderness at the site.  Follow instructions from your health care provider about how to take care of your radial site wound. Check the wound every day for signs of infection.  Do not lift anything that is heavier than 10 lb (4.5 kg), or the limit that you are told, until your health care provider says that it is safe. This information is not intended to replace advice given to you by your health care provider. Make sure you discuss any questions you have with your health care  provider. Document Revised: 06/16/2017 Document Reviewed: 06/16/2017 Elsevier Patient Education  2020 [employer]  

## 2019-12-28 NOTE — Research (Signed)
CADFEM Informed Consent   Subject Name: Candice Richardson  Subject met inclusion and exclusion criteria.  The informed consent form, study requirements and expectations were reviewed with the subject and questions and concerns were addressed prior to the signing of the consent form.  The subject verbalized understanding of the trial requirements.  The subject agreed to participate in the CADFEM trial and signed the informed consent at 0655 on 12/28/2019.  The informed consent was obtained prior to performance of any protocol-specific procedures for the subject.  A copy of the signed informed consent was given to the subject and a copy was placed in the subject's medical record.   Maurilio Puryear

## 2019-12-28 NOTE — Progress Notes (Signed)
Patient and husband was given discharge instructions. Both verbalized understanding. 

## 2020-01-02 ENCOUNTER — Ambulatory Visit: Payer: Medicare Other | Admitting: Cardiology

## 2020-01-07 NOTE — Progress Notes (Signed)
Cardiology Office Note:    Date:  01/08/2020   ID:  Royann Wildasin, DOB 04-27-1946, MRN [id number]  PCP:  Jarome Matin, MD  Cardiologist:  Donato Schultz, MD  Electrophysiologist:  None   Referring MD: Jarome Matin, MD   Chief Complaint:  Hospitalization Follow-up (s/p cath )    Patient Profile:    Candice Richardson is a 74 y.o. female with:   Coronary artery disease   Cath 8/21: mod 3 v CAD, borderline DFR in LAD - consider LAD PCI if symptoms refractory   Hypertension   Hyperlipidemia   GERD  DJD, s/p L TKR in 12/17 and R TKR in 04/2019  FHx of CAD  Prior CV studies: Cardiac catheterization 2020/01/17 LM normal LAD mid 40, 60 (DFR=0.91), 60 (DFR 0.87) LCx prox 50 (DFR 0.98), OM2 50 (DFR 0.98 RCA prox 55 (DFR 0.98) EF > 65 Conclusions: 1. Moderate three-vessel coronary artery disease with diffuse medial calcification.  There are 40-60% stenoses involving the mid LAD, proximal LCx, OM2, and proximal RCA.  The sequential mid LAD stenoses are borderline hemodynamically significant (DFR 0.87).  LCx/OM2 and proximal RCA lesions are not hemodynamically significant. 2. Hyperdynamic left ventricular systolic function with normal filling pressure.  Recommendations: 1. Given atypical symptoms (left arm pain without chest pain or dyspnea) and borderline single-vessel disease involving a tortuous and heavily calcified LAD, I favor optimization of medical therapy and exploration of other potential causes of left shoulder pain.  I would reserve PCI attempt to the mid LAD for refractory symptoms.  We will add isosorbide mononitrate 15 mg daily to current regimen of amlodipine and metoprolol. 2. Continue medical therapy to prevent progression of disease.   Myoview 10/14/2017 EF 72, no ischemia, low risk  Echocardiogram 10/12/2017 EF 55-60, normal wall motion, PASP 39  Myoview 11/14/2013 EF 69, no ischemia, low risk  ETT 10/18/2013 2 to 3 mm ST depression with 9 minutes of  exercise  Echocardiogram 10/19/2013 Mild focal basal septal hypertrophy, EF 55-60, normal wall motion, mild MR, mild TR, PASP 37  History of Present Illness:    Candice Richardson was last 12/26/19. She had symptoms of exertional L arm pain c/w unstable angina.  Cardiac catheterization was arranged. This demonstrated mod 3 v CAD.  DFR in the LCx and RCA were not hemodynamically significant.  DFR in the mid LAD was of borderline significance.  Med Rx was recommended.  PCI of the LAD could be considered if refractory angina.    She returns for follow up.   She is here alone.  She has not had any further pain in her left arm.  She still has pain in her shoulder.  She is not having any chest pain.  She has not had any pleuritic chest pain.  She has not had significant shortness of breath.  Her right wrist has remained somewhat swollen since the heart cath.  There was significant amounts of bruising but this is improved.  She has not been able to tolerate isosorbide secondary to headaches.  Of note, she also stopped ezetimibe secondary to arthralgias.  Past Medical History:  Diagnosis Date  . Arthritis   . Diverticulitis   . GERD (gastroesophageal reflux disease)   . Heart murmur   . Hyperlipidemia   . Hypertension   . Torn meniscus     Current Medications: Current Meds  Medication Sig  . amLODipine (NORVASC) 5 MG tablet Take 5 mg by mouth every evening.   . Ascorbic Acid (VITAMIN C) 1000  MG tablet Take 1,000 mg by mouth daily.   . Calcium Carbonate-Vitamin D (CALTRATE 600+D PO) Take 1 tablet by mouth daily.   . Cholecalciferol (DIALYVITE VITAMIN D 5000 PO) Take 5,000 Units by mouth daily.   . Coenzyme Q10 (CO Q-10) 100 MG CAPS Take 100 mg by mouth daily.  Marland Kitchen estradiol (ESTRACE) 0.1 MG/GM vaginal cream Place 1 Applicatorful vaginally once a week.  . Multiple Vitamins-Minerals (MULTIVITAMIN WITH MINERALS) tablet Take 1 tablet by mouth daily.  . nitroGLYCERIN (NITROSTAT) 0.4 MG SL tablet Place 1 tablet  (0.4 mg total) under the tongue every 5 (five) minutes as needed for chest pain.  Marland Kitchen omeprazole (PRILOSEC) 20 MG capsule Take 20 mg by mouth every evening.   . rosuvastatin (CRESTOR) 20 MG tablet Take 20 mg by mouth daily.  . vitamin B-12 (CYANOCOBALAMIN) 1000 MCG tablet Take 1,000 mcg by mouth daily.   . [DISCONTINUED] isosorbide mononitrate (IMDUR) 30 MG 24 hr tablet Take 0.5 tablets (15 mg total) by mouth daily.  . [DISCONTINUED] metoprolol succinate (TOPROL-XL) 50 MG 24 hr tablet Take 50 mg by mouth daily. Take with or immediately following a meal.     Allergies:   Patient has no known allergies.   Social History   Tobacco Use  . Smoking status: Never Smoker  . Smokeless tobacco: Never Used  Vaping Use  . Vaping Use: Never used  Substance Use Topics  . Alcohol use: Yes    Alcohol/week: 9.0 standard drinks    Types: 9 Glasses of wine per week  . Drug use: No     Family Hx: The patient's family history includes Colon cancer in her cousin; Colon polyps in her brother; Heart disease in her mother; Stomach cancer in her father. There is no history of Esophageal cancer, Rectal cancer, Pancreatic cancer, or Breast cancer.  ROS   EKGs/Labs/Other Test Reviewed:    EKG:  EKG is not ordered today.  The ekg ordered today demonstrates n/a   Recent Labs: 12/26/2019: BUN 16; Creatinine, Ser 0.95; Hemoglobin 15.9; Platelets 309; Potassium 4.8; Sodium 144   Recent Lipid Panel Lab Results  Component Value Date/Time   CHOL 160 10/11/2017 08:50 AM   TRIG 110 10/11/2017 08:50 AM   HDL 59 10/11/2017 08:50 AM   CHOLHDL 2.7 10/11/2017 08:50 AM   LDLCALC 79 10/11/2017 08:50 AM    Physical Exam:    VS:  BP 130/68   Pulse 86   Ht 5\' 6"  (1.676 m)   Wt 152 lb (68.9 kg)   SpO2 98%   BMI 24.53 kg/m     Wt Readings from Last 3 Encounters:  01/08/20 152 lb (68.9 kg)  12/28/19 146 lb (66.2 kg)  12/26/19 149 lb (67.6 kg)     Constitutional:      Appearance: Healthy appearance. Not in  distress.  Neck:     Vascular: JVD normal.  Pulmonary:     Effort: Pulmonary effort is normal.     Breath sounds: No wheezing. No rales.  Cardiovascular:     Normal rate. Regular rhythm. Normal S1. Normal S2.     Murmurs: There is no murmur.     Comments: R wrist without hematoma; mild R forearm swelling noted, mild ecchymosis Edema:    Peripheral edema absent.  Abdominal:     Palpations: Abdomen is soft.  Skin:    General: Skin is warm and dry.  Neurological:     Mental Status: Alert and oriented to person, place and time.  Cranial Nerves: Cranial nerves are intact.       ASSESSMENT & PLAN:    1. Coronary artery disease involving native coronary artery of native heart with angina pectoris (HCC) Moderate nonobstructive disease by recent cardiac catheterization.  There was one LAD lesion of borderline hemodynamic significance by fractional flow reserve.  The recommendation was to continue med Rx and consider PCI if she had refractory angina.  Her shoulder pain sounds MSK.  I have asked her to follow up with her PCP for this.  She can take some Aleve for a few days to see if this helps. She could not tolerate Isosorbide due to HAs.  She has some residual swelling in her R forearm.  Her pulses are good and her cap refill is normal.  There is no bruit over her wrist.  I suspect this is related to trauma form the cath and should resolve over the next couple of weeks.  I have asked her to call if it does not improve.   -DC Isosorbide  -Increase Metoprolol succinate to 75 mg once daily   -ASA 81 mg once daily   -Continue statin Rx, Amlodipine  -FU in 8 weeks with Dr. Anne Fu  -Return sooner if L arm symptoms worsen  2. Essential hypertension The patient's blood pressure is controlled on her current regimen.  Continue current therapy.    3. Hyperlipidemia, unspecified hyperlipidemia type Managed by PCP.  Goal LDL < 70.  I have asked her to follow up with primary care as she had to stop  Ezetimibe.  She may need her Rosuvastatin increased to 40 mg or possibly consider PCSK9 inhibitor.        Dispo:  Return in about 8 weeks (around 03/04/2020) for Routine Follow Up w/ Dr. Anne Fu, in person.   Medication Adjustments/Labs and Tests Ordered: Current medicines are reviewed at length with the patient today.  Concerns regarding medicines are outlined above.  Tests Ordered: No orders of the defined types were placed in this encounter.  Medication Changes: Meds ordered this encounter  Medications  . aspirin EC 81 MG tablet    Sig: Take 1 tablet (81 mg total) by mouth daily. Swallow whole.    Dispense:  30 tablet    Refill:  11  . metoprolol succinate (TOPROL-XL) 50 MG 24 hr tablet    Sig: Take 1.5 tablets (75 mg total) by mouth daily. Take with or immediately following a meal.    Dispense:  135 tablet    Refill:  1    Signed, Tereso Newcomer, PA-C  01/08/2020 1:06 PM    [facility] [address] [place], [place], [place]  [address] Phone: [phone]; Fax: [phone]

## 2020-01-08 ENCOUNTER — Other Ambulatory Visit: Payer: Self-pay

## 2020-01-08 ENCOUNTER — Ambulatory Visit: Payer: Medicare Other | Admitting: Physician Assistant

## 2020-01-08 ENCOUNTER — Encounter: Payer: Self-pay | Admitting: Physician Assistant

## 2020-01-08 VITALS — BP 130/68 | HR 86 | Ht 66.0 in | Wt 152.0 lb

## 2020-01-08 DIAGNOSIS — E785 Hyperlipidemia, unspecified: Secondary | ICD-10-CM | POA: Diagnosis not present

## 2020-01-08 DIAGNOSIS — I10 Essential (primary) hypertension: Secondary | ICD-10-CM

## 2020-01-08 DIAGNOSIS — I25119 Atherosclerotic heart disease of native coronary artery with unspecified angina pectoris: Secondary | ICD-10-CM | POA: Diagnosis not present

## 2020-01-08 MED ORDER — ASPIRIN EC 81 MG PO TBEC
81.0000 mg | DELAYED_RELEASE_TABLET | Freq: Every day | ORAL | 11 refills | Status: AC
Start: 2020-01-08 — End: ?

## 2020-01-08 MED ORDER — METOPROLOL SUCCINATE ER 50 MG PO TB24
75.0000 mg | ORAL_TABLET | Freq: Every day | ORAL | 1 refills | Status: DC
Start: 2020-01-08 — End: 2020-09-10

## 2020-01-08 NOTE — Patient Instructions (Addendum)
Medication Instructions:  Your physician has recommended you make the following change in your medication:   1) Stop Imdur 30 mg 2) Restart Aspirin 81 mg, 1 tablet by mouth once a day 3) Increase Metoprolol to 75 mg, 1.5 tablets by mouth once a day  *If you need a refill on your cardiac medications before your next appointment, please call your pharmacy*  Lab Work: None ordered today  Testing/Procedures: None ordered today  Follow-Up: On 03/06/20 at 11:20AM with Donato Schultz, MD  Other Instructions Call your PCP about Zetia side effects

## 2020-01-16 ENCOUNTER — Ambulatory Visit: Payer: Medicare Other | Admitting: Physician Assistant

## 2020-02-15 DIAGNOSIS — N390 Urinary tract infection, site not specified: Secondary | ICD-10-CM | POA: Diagnosis not present

## 2020-02-15 DIAGNOSIS — R300 Dysuria: Secondary | ICD-10-CM | POA: Diagnosis not present

## 2020-02-21 DIAGNOSIS — R300 Dysuria: Secondary | ICD-10-CM | POA: Diagnosis not present

## 2020-03-06 ENCOUNTER — Encounter: Payer: Self-pay | Admitting: Cardiology

## 2020-03-06 ENCOUNTER — Ambulatory Visit: Payer: Medicare Other | Admitting: Cardiology

## 2020-03-06 ENCOUNTER — Other Ambulatory Visit: Payer: Self-pay

## 2020-03-06 VITALS — BP 140/70 | HR 80 | Ht 66.0 in | Wt 153.0 lb

## 2020-03-06 DIAGNOSIS — I25119 Atherosclerotic heart disease of native coronary artery with unspecified angina pectoris: Secondary | ICD-10-CM

## 2020-03-06 DIAGNOSIS — I209 Angina pectoris, unspecified: Secondary | ICD-10-CM

## 2020-03-06 DIAGNOSIS — E785 Hyperlipidemia, unspecified: Secondary | ICD-10-CM

## 2020-03-06 DIAGNOSIS — I10 Essential (primary) hypertension: Secondary | ICD-10-CM | POA: Diagnosis not present

## 2020-03-06 NOTE — Progress Notes (Signed)
Cardiology Office Note:    Date:  03/06/2020   ID:  Candice EhrlichDianne Richardson, DOB [birth year], MRN [id number]  PCP:  Candice MatinPaterson, Daniel, MD  [facility] Cardiologist:  Candice SchultzMark Win Guajardo, MD  [facility] Electrophysiologist:  None   Referring MD: Candice MatinPaterson, Daniel, MD     History of Present Illness:    Candice PentaDianne Richardson is a 74 y.o. female here for follow-up of coronary artery disease.  Had cardiac catheterization in August 2021 that showed moderate three-vessel coronary artery disease with borderline decrease in FFR and LAD.  Advice was to consider LAD PCI if symptoms were refractory.  50 mg was added to her amlodipine and metoprolol.  Previously she was having pain in the left  Her echocardiogram showed normal ejection fraction of 60%.  Prior nuclear stress test in 2019 showed no ischemia.    Past Medical History:  Diagnosis Date   Arthritis    Diverticulitis    GERD (gastroesophageal reflux disease)    Heart murmur    Hyperlipidemia    Hypertension    Torn meniscus     Past Surgical History:  Procedure Laterality Date   APPENDECTOMY  1949   7818 months old   INTRAVASCULAR PRESSURE WIRE/FFR STUDY N/A 12/28/2019   Procedure: INTRAVASCULAR PRESSURE WIRE/FFR STUDY;  Surgeon: Candice KendallEnd, Christopher, MD;  [facility];  Service: Cardiovascular;  Laterality: N/A;  LAD, CFX RCA   KNEE ARTHROSCOPY W/ MENISCAL REPAIR Right 10/2018   LEFT HEART CATH AND CORONARY ANGIOGRAPHY N/A 12/28/2019   Procedure: LEFT HEART CATH AND CORONARY ANGIOGRAPHY;  Surgeon: Candice KendallEnd, Christopher, MD;  [facility];  Service: Cardiovascular;  Laterality: N/A;   TOTAL KNEE ARTHROPLASTY Left 05/08/2016   Procedure: LEFT TOTAL KNEE ARTHROPLASTY;  Surgeon: Candice Mcalpineobert Collins, MD;  Location: [facility];  Service: Orthopedics;  Laterality: Left;   TOTAL KNEE ARTHROPLASTY Right 05/12/2019   Procedure: TOTAL KNEE ARTHROPLASTY;  Surgeon: Candice Mcalpineollins, Robert, MD;  Location: [facility];  Service: Orthopedics;   Laterality: Right;  adductor canal    Current Medications: Current Meds  Medication Sig   amLODipine (NORVASC) 5 MG tablet Take 5 mg by mouth every evening.    Ascorbic Acid (VITAMIN C) 1000 MG tablet Take 1,000 mg by mouth daily.    aspirin EC 81 MG tablet Take 1 tablet (81 mg total) by mouth daily. Swallow whole.   Calcium Carbonate-Vitamin D (CALTRATE 600+D PO) Take 1 tablet by mouth daily.    Cholecalciferol (DIALYVITE VITAMIN D 5000 PO) Take 5,000 Units by mouth daily.    Coenzyme Q10 (CO Q-10) 100 MG CAPS Take 100 mg by mouth daily.   estradiol (ESTRACE) 0.1 MG/GM vaginal cream Place 1 Applicatorful vaginally once a week.   metoprolol succinate (TOPROL-XL) 50 MG 24 hr tablet Take 1.5 tablets (75 mg total) by mouth daily. Take with or immediately following a meal.   Multiple Vitamins-Minerals (MULTIVITAMIN WITH MINERALS) tablet Take 1 tablet by mouth daily.   nitroGLYCERIN (NITROSTAT) 0.4 MG SL tablet Place 1 tablet (0.4 mg total) under the tongue every 5 (five) minutes as needed for chest pain.   omeprazole (PRILOSEC) 20 MG capsule Take 20 mg by mouth every evening.    rosuvastatin (CRESTOR) 20 MG tablet Take 20 mg by mouth daily.   vitamin B-12 (CYANOCOBALAMIN) 1000 MCG tablet Take 1,000 mcg by mouth daily.      Allergies:   Patient has no known allergies.   Social History   Socioeconomic History   Marital status: Widowed  Spouse name: Not on file   Number of children: Not on file   Years of education: Not on file   Highest education level: Not on file  Occupational History   Not on file  Tobacco Use   Smoking status: Never Smoker   Smokeless tobacco: Never Used  Vaping Use   Vaping Use: Never used  Substance and Sexual Activity   Alcohol use: Yes    Alcohol/week: 9.0 standard drinks    Types: 9 Glasses of wine per week   Drug use: No   Sexual activity: Not on file  Other Topics Concern   Not on file  Social History Narrative   Not on  file   Social Determinants of Health   Financial Resource Strain:    Difficulty of Paying Living Expenses: Not on file  Food Insecurity:    Worried About Running Out of Food in the Last Year: Not on file   Ran Out of Food in the Last Year: Not on file  Transportation Needs:    Lack of Transportation (Medical): Not on file   Lack of Transportation (Non-Medical): Not on file  Physical Activity:    Days of Exercise per Week: Not on file   Minutes of Exercise per Session: Not on file  Stress:    Feeling of Stress : Not on file  Social Connections:    Frequency of Communication with Friends and Family: Not on file   Frequency of Social Gatherings with Friends and Family: Not on file   Attends Religious Services: Not on file   Active Member of Clubs or Organizations: Not on file   Attends Banker Meetings: Not on file   Marital Status: Not on file     Family History: The patient's family history includes Colon cancer in her cousin; Colon polyps in her brother; Heart disease in her mother; Stomach cancer in her father. There is no history of Esophageal cancer, Rectal cancer, Pancreatic cancer, or Breast cancer.  ROS:   Please see the history of present illness.     All other systems reviewed and are negative.  EKGs/Labs/Other Studies Reviewed:    The following studies were reviewed today: Cardiac catheterization on 12/28/2019 reviewed.  Multivessel nonobstructive disease.    Recent Labs: 12/26/2019: BUN 16; Creatinine, Ser 0.95; Hemoglobin 15.9; Platelets 309; Potassium 4.8; Sodium 144  Recent Lipid Panel    Component Value Date/Time   CHOL 160 10/11/2017 0850   TRIG 110 10/11/2017 0850   HDL 59 10/11/2017 0850   CHOLHDL 2.7 10/11/2017 0850   LDLCALC 79 10/11/2017 0850     Risk Assessment/Calculations:       Physical Exam:    VS:  BP 140/70    Pulse 80    Ht 5\' 6"  (1.676 m)    Wt 153 lb (69.4 kg)    SpO2 98%    BMI 24.69 kg/m     Wt  Readings from Last 3 Encounters:  03/06/20 153 lb (69.4 kg)  01/08/20 152 lb (68.9 kg)  12/28/19 146 lb (66.2 kg)     GEN:  Well nourished, well developed in no acute distress HEENT: Normal NECK: No JVD; No carotid bruits LYMPHATICS: No lymphadenopathy CARDIAC: RRR, no murmurs, rubs, gallops RESPIRATORY:  Clear to auscultation without rales, wheezing or rhonchi  ABDOMEN: Soft, non-tender, non-distended MUSCULOSKELETAL:  No edema; No deformity  SKIN: Warm and dry, radial artery site now well-healed. NEUROLOGIC:  Alert and oriented x 3 PSYCHIATRIC:  Normal affect  ASSESSMENT:    1. Coronary artery disease involving native coronary artery of native heart with angina pectoris (HCC)   2. Hyperlipidemia, unspecified hyperlipidemia type   3. Essential hypertension   4. Angina pectoris (HCC)    PLAN:    In order of problems listed above:  Coronary artery disease, multivessel -FFR analysis reviewed from cardiac catheterization.  Continuing with medical management at this point.  Could also consider PCI to LAD if refractory angina occurs.  She previously had symptoms of exertional left arm pain.  In review of Tereso Newcomer, [place] visit from 01/08/2020 seem to be doing quite well without any chest discomfort.  She did have some right wrist swelling post-cath.  This is all healed now.  She also stopped her Zetia because of arthralgias.  Prior LDL 86 HDL 50 total cholesterol 171 triglycerides 173 from July 2021. -Her isosorbide was previously stopped -Scott increased her metoprolol to 75 mg a day. -Continuing with aspirin 81 mg a day -Continuing with amlodipine and statin. -Symptoms of left arm discomfort could be anginal equivalent.  Currently stable. Walking well. Mild SOB.  Continue with exercise, diet.  Essential hypertension -Continue with current regimen.  Medications reviewed as above continue with current medication management.  Her creatinine is 0.95 hemoglobin  15.9  Hyperlipidemia -LDL goal less than seventy.  She stopped her Zetia.  Arthralgias.  Certainly could consider PCSK9 inhibitor given her coronary artery disease.  Prior LDL 86 HDL 50 total cholesterol 171 triglycerides 173 from July 2021.  I want to try to minimize her risk of heart attack.  Her sister has had a heart attack younger than her.   Medication Adjustments/Labs and Tests Ordered: Current medicines are reviewed at length with the patient today.  Concerns regarding medicines are outlined above.  Orders Placed This Encounter  Procedures   AMB Referral to [facility]   No orders of the defined types were placed in this encounter.   Patient Instructions  Medication Instructions:  *If you need a refill on your cardiac medications before your next appointment, please call your pharmacy*  Lab Work: If you have labs (blood work) drawn today and your tests are completely normal, you will receive your results only by:  MyChart Message (if you have MyChart) OR  A paper copy in the mail If you have any lab test that is abnormal or we need to change your treatment, we will call you to review the results.  Follow-Up: At [facility], you and your health needs are our priority.  As part of our continuing mission to provide you with exceptional heart care, we have created designated Provider Care Teams.  These Care Teams include your primary Cardiologist (physician) and Advanced Practice Providers (APPs -  Physician Assistants and Nurse Practitioners) who all work together to provide you with the care you need, when you need it.  We recommend signing up for the patient portal called "MyChart".  Sign up information is provided on this After Visit Summary.  MyChart is used to connect with patients for Virtual Visits (Telemedicine).  Patients are able to view lab/test results, encounter notes, upcoming appointments, etc.  Non-urgent messages can be sent to your provider as well.    To learn more about what you can do with MyChart, go to [email or web address].    Your next appointment:   6 month(s)  The format for your next appointment:   In Person  Provider:   You may see Candice Schultz, MD or one of  the following Advanced Practice Providers on your designated Care Team:    Norma Fredrickson, NP  Nada Boozer, NP  Georgie Chard, NP  Other Instructions You have been referred to Lipid Clinic.      Signed, Candice Schultz, MD  03/06/2020 12:18 PM    [facility]

## 2020-03-06 NOTE — Patient Instructions (Signed)
Medication Instructions:  *If you need a refill on your cardiac medications before your next appointment, please call your pharmacy*  Lab Work: If you have labs (blood work) drawn today and your tests are completely normal, you will receive your results only by: Marland Kitchen MyChart Message (if you have MyChart) OR . A paper copy in the mail If you have any lab test that is abnormal or we need to change your treatment, we will call you to review the results.  Follow-Up: At [facility], you and your health needs are our priority.  As part of our continuing mission to provide you with exceptional heart care, we have created designated Provider Care Teams.  These Care Teams include your primary Cardiologist (physician) and Advanced Practice Providers (APPs -  Physician Assistants and Nurse Practitioners) who all work together to provide you with the care you need, when you need it.  We recommend signing up for the patient portal called "MyChart".  Sign up information is provided on this After Visit Summary.  MyChart is used to connect with patients for Virtual Visits (Telemedicine).  Patients are able to view lab/test results, encounter notes, upcoming appointments, etc.  Non-urgent messages can be sent to your provider as well.   To learn more about what you can do with MyChart, go to [email or web address].    Your next appointment:   6 month(s)  The format for your next appointment:   In Person  Provider:   You may see Donato Schultz, MD or one of the following Advanced Practice Providers on your designated Care Team:    Norma Fredrickson, NP  Nada Boozer, NP  Georgie Chard, NP  Other Instructions You have been referred to Lipid Clinic.

## 2020-03-11 DIAGNOSIS — N200 Calculus of kidney: Secondary | ICD-10-CM | POA: Diagnosis not present

## 2020-03-11 DIAGNOSIS — R3121 Asymptomatic microscopic hematuria: Secondary | ICD-10-CM | POA: Diagnosis not present

## 2020-03-11 DIAGNOSIS — M545 Low back pain, unspecified: Secondary | ICD-10-CM | POA: Diagnosis not present

## 2020-03-12 ENCOUNTER — Other Ambulatory Visit: Payer: Self-pay | Admitting: Obstetrics and Gynecology

## 2020-03-12 DIAGNOSIS — Z1231 Encounter for screening mammogram for malignant neoplasm of breast: Secondary | ICD-10-CM

## 2020-03-14 DIAGNOSIS — H019 Unspecified inflammation of eyelid: Secondary | ICD-10-CM | POA: Diagnosis not present

## 2020-03-18 DIAGNOSIS — R3129 Other microscopic hematuria: Secondary | ICD-10-CM | POA: Diagnosis not present

## 2020-03-18 DIAGNOSIS — R3121 Asymptomatic microscopic hematuria: Secondary | ICD-10-CM | POA: Diagnosis not present

## 2020-03-18 DIAGNOSIS — N200 Calculus of kidney: Secondary | ICD-10-CM | POA: Diagnosis not present

## 2020-03-27 ENCOUNTER — Ambulatory Visit: Payer: Medicare Other

## 2020-04-03 DIAGNOSIS — R3915 Urgency of urination: Secondary | ICD-10-CM | POA: Diagnosis not present

## 2020-04-03 DIAGNOSIS — R3121 Asymptomatic microscopic hematuria: Secondary | ICD-10-CM | POA: Diagnosis not present

## 2020-04-03 DIAGNOSIS — N200 Calculus of kidney: Secondary | ICD-10-CM | POA: Diagnosis not present

## 2020-04-04 ENCOUNTER — Ambulatory Visit
Admission: RE | Admit: 2020-04-04 | Discharge: 2020-04-04 | Disposition: A | Discharge status: Home / Self Care | Payer: Medicare Other | Source: Ambulatory Visit | Attending: Obstetrics and Gynecology | Admitting: Obstetrics and Gynecology

## 2020-04-04 ENCOUNTER — Other Ambulatory Visit: Payer: Self-pay

## 2020-04-04 DIAGNOSIS — Z1231 Encounter for screening mammogram for malignant neoplasm of breast: Secondary | ICD-10-CM

## 2020-09-03 NOTE — Progress Notes (Signed)
Cardiology Office Note    Date:  09/10/2020   ID:  Lashara, Urey April 14, 1946, MRN [id number]   PCP:  Jarome Matin, MD   [facility]  Cardiologist:  Donato Schultz, MD  Advanced Practice Provider:  No care team member to display Electrophysiologist:  None   [id number]   Chief Complaint  Patient presents with  . Follow-up    History of Present Illness:  Candice Richardson is a 75 y.o. female with history of hypertension, HLD, CAD cardiac cath 12/2019 moderate three-vessel CAD with borderline decrease in FFR in the LAD advice was to do LAD PCI if symptoms were refractory.  2D echo with normal LVEF 60%  Patient last saw Dr. Anne Fu 03/06/2020 and symptoms were better.  BP running high at home in the evening-150-160's/70's. Watches salt pretty closely. Having an aching in left back shoulder when she exerts herself. Walks 30 min 5 days/week and has left shoulder aching. She takes a couple advil and it eases. Feels like when she had chest wall inflammation in the past. Takes a lot of Advil. When she had her cath she primarily had left arm pain, no chest pain.    Past Medical History:  Diagnosis Date  . Arthritis   . Diverticulitis   . GERD (gastroesophageal reflux disease)   . Heart murmur   . Hyperlipidemia   . Hypertension   . Torn meniscus     Past Surgical History:  Procedure Laterality Date  . APPENDECTOMY  1947   28 months old  . INTRAVASCULAR PRESSURE WIRE/FFR STUDY N/A 12/28/2019   Procedure: INTRAVASCULAR PRESSURE WIRE/FFR STUDY;  Surgeon: Yvonne Kendall, MD;  [facility];  Service: Cardiovascular;  Laterality: N/A;  LAD, CFX RCA  . KNEE ARTHROSCOPY W/ MENISCAL REPAIR Right 10/2018  . LEFT HEART CATH AND CORONARY ANGIOGRAPHY N/A 12/28/2019   Procedure: LEFT HEART CATH AND CORONARY ANGIOGRAPHY;  Surgeon: Yvonne Kendall, MD;  [facility]: [facility];  Service: Cardiovascular;  Laterality: N/A;  . TOTAL KNEE ARTHROPLASTY  Left 05/08/2016   Procedure: LEFT TOTAL KNEE ARTHROPLASTY;  Surgeon: Eugenia Mcalpine, MD;  Location: [facility];  Service: Orthopedics;  Laterality: Left;  . TOTAL KNEE ARTHROPLASTY Right 05/12/2019   Procedure: TOTAL KNEE ARTHROPLASTY;  Surgeon: Eugenia Mcalpine, MD;  Location: [facility];  Service: Orthopedics;  Laterality: Right;  adductor canal    Current Medications: Current Meds  Medication Sig  . amLODipine (NORVASC) 5 MG tablet Take 5 mg by mouth every evening.   . Ascorbic Acid (VITAMIN C) 1000 MG tablet Take 1,000 mg by mouth daily.   Marland Kitchen aspirin EC 81 MG tablet Take 1 tablet (81 mg total) by mouth daily. Swallow whole.  . Calcium Carbonate-Vitamin D (CALTRATE 600+D PO) Take 1 tablet by mouth daily.   . Cholecalciferol (DIALYVITE VITAMIN D 5000 PO) Take 5,000 Units by mouth daily.   . Coenzyme Q10 (CO Q-10) 100 MG CAPS Take 100 mg by mouth daily.  . metoprolol succinate (TOPROL-XL) 50 MG 24 hr tablet Take 1.5 tablets (75 mg total) by mouth daily. Take with or immediately following a meal.  . Multiple Vitamins-Minerals (MULTIVITAMIN WITH MINERALS) tablet Take 1 tablet by mouth daily.  . nitroGLYCERIN (NITROSTAT) 0.4 MG SL tablet Place 1 tablet (0.4 mg total) under the tongue every 5 (five) minutes as needed for chest pain.  Marland Kitchen omeprazole (PRILOSEC) 20 MG capsule Take 20 mg by mouth every evening.   . rosuvastatin (CRESTOR) 20 MG tablet Take  20 mg by mouth daily.  . vitamin B-12 (CYANOCOBALAMIN) 1000 MCG tablet Take 1,000 mcg by mouth daily.      Allergies:   Patient has no known allergies.   Social History   Socioeconomic History  . Marital status: Widowed    Spouse name: Not on file  . Number of children: Not on file  . Years of education: Not on file  . Highest education level: Not on file  Occupational History  . Not on file  Tobacco Use  . Smoking status: Never Smoker  . Smokeless tobacco: Never Used  Vaping Use  . Vaping Use: Never used  Substance and Sexual Activity  .  Alcohol use: Yes    Alcohol/week: 9.0 standard drinks    Types: 9 Glasses of wine per week  . Drug use: No  . Sexual activity: Not on file  Other Topics Concern  . Not on file  Social History Narrative  . Not on file   Social Determinants of Health   Financial Resource Strain: Not on file  Food Insecurity: Not on file  Transportation Needs: Not on file  Physical Activity: Not on file  Stress: Not on file  Social Connections: Not on file     Family History:  The patient's family history includes Colon cancer in her cousin; Colon polyps in her brother; Heart disease in her mother; Stomach cancer in her father.   ROS:   Please see the history of present illness.    ROS All other systems reviewed and are negative.   PHYSICAL EXAM:   VS:  BP 138/82   Pulse 69   Ht 5\' 6"  (1.676 m)   Wt 157 lb 3.2 oz (71.3 kg)   SpO2 97%   BMI 25.37 kg/m   Physical Exam  GEN: Well nourished, well developed, in no acute distress  Neck: no JVD, carotid bruits, or masses Cardiac:RRR; no murmurs, rubs, or gallops  Respiratory:  clear to auscultation bilaterally, normal work of breathing GI: soft, nontender, nondistended, + BS Ext: without cyanosis, clubbing, or edema, Good distal pulses bilaterally Neuro:  Alert and Oriented x 3, Psych: euthymic mood, full affect  Wt Readings from Last 3 Encounters:  09/10/20 157 lb 3.2 oz (71.3 kg)  03/06/20 153 lb (69.4 kg)  01/08/20 152 lb (68.9 kg)      Studies/Labs Reviewed:   EKG:  EKG is ordered today.  The ekg ordered today demonstrates NSR, normal EKG  Recent Labs: 12/26/2019: BUN 16; Creatinine, Ser 0.95; Hemoglobin 15.9; Platelets 309; Potassium 4.8; Sodium 144   Lipid Panel    Component Value Date/Time   CHOL 160 10/11/2017 0850   TRIG 110 10/11/2017 0850   HDL 59 10/11/2017 0850   CHOLHDL 2.7 10/11/2017 0850   LDLCALC 79 10/11/2017 0850    Additional studies/ records that were reviewed today include:  Cardiac cath  12/28/2019 Conclusions: 1. Moderate three-vessel coronary artery disease with diffuse medial calcification.  There are 40-60% stenoses involving the mid LAD, proximal LCx, OM2, and proximal RCA.  The sequential mid LAD stenoses are borderline hemodynamically significant (DFR 0.87).  LCx/OM2 and proximal RCA lesions are not hemodynamically significant. 2. Hyperdynamic left ventricular systolic function with normal filling pressure.   Recommendations: 1. Given atypical symptoms (left arm pain without chest pain or dyspnea) and borderline single-vessel disease involving a tortuous and heavily calcified LAD, I favor optimization of medical therapy and exploration of other potential causes of left shoulder pain.  I would reserve PCI  attempt to the mid LAD for refractory symptoms.  We will add isosorbide mononitrate 15 mg daily to current regimen of amlodipine and metoprolol. 2. Continue medical therapy to prevent progression of disease.   Yvonne Kendall, MD [facility]    Risk Assessment/Calculations:         ASSESSMENT:    1. Coronary artery disease involving native coronary artery of native heart without angina pectoris   2. Essential hypertension   3. Hyperlipidemia, unspecified hyperlipidemia type      PLAN:  In order of problems listed above:  CAD on cardiac cath 12/2019 multivessel with borderline decrease in FFR in the LAD.  Could attempt PCI of the LAD if symptoms are refractory on aspirin, amlodipine, statin. Having left back shoulder pain when she walks relieved with rest and advil. BP also up. Will increase metoprolol 50 mg bid to see if this helps. Asked her to try NTG.Early f/u with Dr. Anne Fu  Essential hypertension BP running high in afternoon. Increasing metoprolol  Hyperlipidemia LDL 86 on Crestor, Zetia stopped due to myalgias but she is willing to try again. She is scheduled for labs in July. Insurance wouldn't pay for lipid clinic.  Shared Decision Making/Informed  Consent        Medication Adjustments/Labs and Tests Ordered: Current medicines are reviewed at length with the patient today.  Concerns regarding medicines are outlined above.  Medication changes, Labs and Tests ordered today are listed in the Patient Instructions below. There are no Patient Instructions on file for this visit.   Elson Clan, PA-C  09/10/2020 10:48 AM    [facility] [address] [place], [place], [place]  [address] Phone: [phone]; Fax: [phone]

## 2020-09-10 ENCOUNTER — Other Ambulatory Visit: Payer: Self-pay

## 2020-09-10 ENCOUNTER — Encounter: Payer: Self-pay | Admitting: Physician Assistant

## 2020-09-10 ENCOUNTER — Ambulatory Visit: Payer: Medicare Other | Admitting: Physician Assistant

## 2020-09-10 VITALS — BP 138/82 | HR 69 | Ht 66.0 in | Wt 157.2 lb

## 2020-09-10 DIAGNOSIS — I10 Essential (primary) hypertension: Secondary | ICD-10-CM | POA: Diagnosis not present

## 2020-09-10 DIAGNOSIS — E785 Hyperlipidemia, unspecified: Secondary | ICD-10-CM | POA: Diagnosis not present

## 2020-09-10 DIAGNOSIS — I2510 Atherosclerotic heart disease of native coronary artery without angina pectoris: Secondary | ICD-10-CM

## 2020-09-10 MED ORDER — METOPROLOL SUCCINATE ER 50 MG PO TB24
50.0000 mg | ORAL_TABLET | Freq: Two times a day (BID) | ORAL | 3 refills | Status: DC
Start: 2020-09-10 — End: 2021-10-15

## 2020-09-10 MED ORDER — EZETIMIBE 10 MG PO TABS
10.0000 mg | ORAL_TABLET | Freq: Every day | ORAL | 3 refills | Status: DC
Start: 2020-09-10 — End: 2021-10-13

## 2020-09-10 NOTE — Patient Instructions (Signed)
Medication Instructions:  Your physician has recommended you make the following change in your medication:   INCREASE: Metoprolol to 50mg  twice daily  *If you need a refill on your cardiac medications before your next appointment, please call your pharmacy*   Lab Work: None If you have labs (blood work) drawn today and your tests are completely normal, you will receive your results only by: MyChart Message (if you have MyChart) OR . A paper copy in the mail If you have any lab test that is abnormal or we need to change your treatment, we will call you to review the results.  Follow-Up: At [facility], you and your health needs are our priority.  As part of our continuing mission to provide you with exceptional heart care, we have created designated Provider Care Teams.  These Care Teams include your primary Cardiologist (physician) and Advanced Practice Providers (APPs -  Physician Assistants and Nurse Practitioners) who all work together to provide you with the care you need, when you need it.  Your next appointment:   As scheduled

## 2020-10-28 ENCOUNTER — Telehealth: Payer: Self-pay | Admitting: Cardiology

## 2020-10-28 NOTE — Telephone Encounter (Signed)
Pt is calling in regards to obtaining a Anti-Viral medicine due to her testing positive for Covid-19 on 10/26/20. Pt states she would like to get started on the medicine ASAP. Please advise

## 2020-10-28 NOTE — Telephone Encounter (Signed)
Reviewed this request as well as the one sent through MyChart, with Dr Anne Fu.  He requests the patient contact her PCP for further evaluation and treatment as necessary.  Responded to MyChart message with this information.

## 2020-10-28 NOTE — Telephone Encounter (Signed)
Pt has read her MyChart message and responded. She will contact her PCP for treatment.

## 2020-12-04 ENCOUNTER — Other Ambulatory Visit: Payer: Self-pay

## 2020-12-04 ENCOUNTER — Encounter: Payer: Self-pay | Admitting: Cardiology

## 2020-12-04 ENCOUNTER — Ambulatory Visit: Payer: Medicare Other | Admitting: Cardiology

## 2020-12-04 VITALS — BP 132/76 | HR 75 | Ht 66.0 in | Wt 155.6 lb

## 2020-12-04 DIAGNOSIS — I10 Essential (primary) hypertension: Secondary | ICD-10-CM

## 2020-12-04 DIAGNOSIS — E785 Hyperlipidemia, unspecified: Secondary | ICD-10-CM | POA: Diagnosis not present

## 2020-12-04 DIAGNOSIS — I2510 Atherosclerotic heart disease of native coronary artery without angina pectoris: Secondary | ICD-10-CM | POA: Diagnosis not present

## 2020-12-04 NOTE — Progress Notes (Signed)
Cardiology Office Note:    Date:  12/04/2020   ID:  Danetta, Prom 09-02-1945, MRN [id number]  PCP:  Jarome Matin, MD   [facility] Providers Cardiologist:  Donato Schultz, MD     Referring MD: Jarome Matin, MD    History of Present Illness:    Candice Richardson is a 75 y.o. female here for the follow-up of coronary artery disease, post catheterization on 12/2019 with moderate three-vessel CAD with borderline decrease in FFR in the LAD.  Plan was to only perform PCI if symptoms were refractory to medical management.  Pump function was normal on echocardiogram with EF of 60%.  She was last seen in our office on 09/10/2020 and blood pressures were running fairly high at home in the evening in the 150s and 160s.  She has been watching her salt very closely.  She does take a couple Advil at times.  Left shoulder aches at times.  Metoprolol was increased to 50 mg twice a day.  She is also on both Zetia as well as Crestor.  The first time she took Zetia, she did have some myalgias.  This has improved.    Past Medical History:  Diagnosis Date   Arthritis    Diverticulitis    GERD (gastroesophageal reflux disease)    Heart murmur    Hyperlipidemia    Hypertension    Torn meniscus     Past Surgical History:  Procedure Laterality Date   APPENDECTOMY  1947   84 months old   INTRAVASCULAR PRESSURE WIRE/FFR STUDY N/A 12/28/2019   Procedure: INTRAVASCULAR PRESSURE WIRE/FFR STUDY;  Surgeon: Yvonne Kendall, MD;  [facility];  Service: Cardiovascular;  Laterality: N/A;  LAD, CFX RCA   KNEE ARTHROSCOPY W/ MENISCAL REPAIR Right 10/2018   LEFT HEART CATH AND CORONARY ANGIOGRAPHY N/A 12/28/2019   Procedure: LEFT HEART CATH AND CORONARY ANGIOGRAPHY;  Surgeon: Yvonne Kendall, MD;  [facility];  Service: Cardiovascular;  Laterality: N/A;   TOTAL KNEE ARTHROPLASTY Left 05/08/2016   Procedure: LEFT TOTAL KNEE ARTHROPLASTY;  Surgeon: Eugenia Mcalpine, MD;   Location: [facility];  Service: Orthopedics;  Laterality: Left;   TOTAL KNEE ARTHROPLASTY Right 05/12/2019   Procedure: TOTAL KNEE ARTHROPLASTY;  Surgeon: Eugenia Mcalpine, MD;  Location: [facility];  Service: Orthopedics;  Laterality: Right;  adductor canal    Current Medications: Current Meds  Medication Sig   amLODipine (NORVASC) 5 MG tablet Take 5 mg by mouth every evening.    Ascorbic Acid (VITAMIN C) 1000 MG tablet Take 1,000 mg by mouth daily.    aspirin EC 81 MG tablet Take 1 tablet (81 mg total) by mouth daily. Swallow whole.   Calcium Carbonate-Vitamin D (CALTRATE 600+D PO) Take 1 tablet by mouth daily.    Cholecalciferol (DIALYVITE VITAMIN D 5000 PO) Take 5,000 Units by mouth daily.    Coenzyme Q10 (CO Q-10) 100 MG CAPS Take 100 mg by mouth daily.   ezetimibe (ZETIA) 10 MG tablet Take 1 tablet (10 mg total) by mouth daily.   metoprolol succinate (TOPROL-XL) 50 MG 24 hr tablet Take 1 tablet (50 mg total) by mouth 2 (two) times daily. Take with or immediately following a meal.   Multiple Vitamins-Minerals (MULTIVITAMIN WITH MINERALS) tablet Take 1 tablet by mouth daily.   nitroGLYCERIN (NITROSTAT) 0.4 MG SL tablet Place 1 tablet (0.4 mg total) under the tongue every 5 (five) minutes as needed for chest pain.   omeprazole (PRILOSEC) 20 MG capsule Take 20  mg by mouth every evening.    rosuvastatin (CRESTOR) 20 MG tablet Take 20 mg by mouth daily.   vitamin B-12 (CYANOCOBALAMIN) 1000 MCG tablet Take 1,000 mcg by mouth daily.      Allergies:   Patient has no known allergies.   Social History   Socioeconomic History   Marital status: Widowed    Spouse name: Not on file   Number of children: Not on file   Years of education: Not on file   Highest education level: Not on file  Occupational History   Not on file  Tobacco Use   Smoking status: Never   Smokeless tobacco: Never  Vaping Use   Vaping Use: Never used  Substance and Sexual Activity   Alcohol use: Yes    Alcohol/week: 9.0  standard drinks    Types: 9 Glasses of wine per week   Drug use: No   Sexual activity: Not on file  Other Topics Concern   Not on file  Social History Narrative   Not on file   Social Determinants of Health   Financial Resource Strain: Not on file  Food Insecurity: Not on file  Transportation Needs: Not on file  Physical Activity: Not on file  Stress: Not on file  Social Connections: Not on file     Family History: The patient's family history includes Colon cancer in her cousin; Colon polyps in her brother; Heart disease in her mother; Stomach cancer in her father. There is no history of Esophageal cancer, Rectal cancer, Pancreatic cancer, or Breast cancer.  ROS:   Please see the history of present illness.     All other systems reviewed and are negative.  EKGs/Labs/Other Studies Reviewed:    The following studies were reviewed today  Cardiac cath 12/28/2019 Conclusions: Moderate three-vessel coronary artery disease with diffuse medial calcification.  There are 40-60% stenoses involving the mid LAD, proximal LCx, OM2, and proximal RCA.  The sequential mid LAD stenoses are borderline hemodynamically significant (DFR 0.87).  LCx/OM2 and proximal RCA lesions are not hemodynamically significant. Hyperdynamic left ventricular systolic function with normal filling pressure.   Recommendations: Given atypical symptoms (left arm pain without chest pain or dyspnea) and borderline single-vessel disease involving a tortuous and heavily calcified LAD, I favor optimization of medical therapy and exploration of other potential causes of left shoulder pain.  I would reserve PCI attempt to the mid LAD for refractory symptoms.  We will add isosorbide mononitrate 15 mg daily to current regimen of amlodipine and metoprolol. Continue medical therapy to prevent progression of disease.   Yvonne Kendall, MD   Recent Labs: 12/26/2019: BUN 16; Creatinine, Ser 0.95; Hemoglobin 15.9; Platelets 309;  Potassium 4.8; Sodium 144  Recent Lipid Panel    Component Value Date/Time   CHOL 160 10/11/2017 0850   TRIG 110 10/11/2017 0850   HDL 59 10/11/2017 0850   CHOLHDL 2.7 10/11/2017 0850   LDLCALC 79 10/11/2017 0850     Risk Assessment/Calculations:          Physical Exam:    VS:  BP 132/76   Pulse 75   Ht 5\' 6"  (1.676 m)   Wt 155 lb 9.6 oz (70.6 kg)   SpO2 97%   BMI 25.11 kg/m     Wt Readings from Last 3 Encounters:  12/04/20 155 lb 9.6 oz (70.6 kg)  09/10/20 157 lb 3.2 oz (71.3 kg)  03/06/20 153 lb (69.4 kg)     GEN:  Well nourished, well developed in  no acute distress HEENT: Normal NECK: No JVD; No carotid bruits LYMPHATICS: No lymphadenopathy CARDIAC: RRR, no murmurs, rubs, gallops RESPIRATORY:  Clear to auscultation without rales, wheezing or rhonchi  ABDOMEN: Soft, non-tender, non-distended MUSCULOSKELETAL:  No edema; No deformity  SKIN: Warm and dry NEUROLOGIC:  Alert and oriented x 3 PSYCHIATRIC:  Normal affect   ASSESSMENT:    1. Coronary artery disease involving native coronary artery of native heart without angina pectoris   2. Essential hypertension   3. Hyperlipidemia, unspecified hyperlipidemia type    PLAN:    In order of problems listed above:  Coronary artery disease - As described above from cardiac catheterization report.  Continue with medical management.  If continue with aspirin amlodipine statin.   Symptoms do become more worrisome, we could always consider PCI of the LAD. -Metoprolol was increased to 50 twice daily at last visit. -Her symptoms can be baffling at times.  Sometimes she feels as though her left shoulder pain may be musculoskeletal versus anginal it is hard to tell.  The way she describes it it could certainly be musculoskeletal type of discomfort.  She is not describing any centralized chest pressure or pain.  Essential hypertension - Blood pressure has been fluctuating.  She had increased her metoprolol to 50 mg twice  daily.  Doing better.  Checking her blood pressures at home.  Also on amlodipine 5 mg once a day for medical management.  No changes made.  Refills as needed.  Hyperlipidemia - On Crestor 20mg .  Zetia had some myalgias but better now. .  She was willing to try this again at last visit.  Insurance would not pay for lipid clinic. Goal < 70.  She is getting blood work soon.  Her prior LDL was 86 prior to medication changes.  I will see her back in 1 year.  If symptoms worsen or become more worrisome she will let know.      Medication Adjustments/Labs and Tests Ordered: Current medicines are reviewed at length with the patient today.  Concerns regarding medicines are outlined above.  No orders of the defined types were placed in this encounter.  No orders of the defined types were placed in this encounter.   Patient Instructions  Medication Instructions:  Your physician recommends that you continue on your current medications as directed. Please refer to the Current Medication list given to you today.  *If you need a refill on your cardiac medications before your next appointment, please call your pharmacy*   Lab Work: None   Testing/Procedures: None   Follow-Up: At [facility], you and your health needs are our priority.  As part of our continuing mission to provide you with exceptional heart care, we have created designated Provider Care Teams.  These Care Teams include your primary Cardiologist (physician) and Advanced Practice Providers (APPs -  Physician Assistants and Nurse Practitioners) who all work together to provide you with the care you need, when you need it.   Your next appointment:   1 year(s)  The format for your next appointment:   In Person  Provider:   [facility], MD         Signed, Donato Schultz, MD  12/04/2020 2:53 PM    [facility]

## 2020-12-04 NOTE — Patient Instructions (Signed)
Medication Instructions:  Your physician recommends that you continue on your current medications as directed. Please refer to the Current Medication list given to you today.  *If you need a refill on your cardiac medications before your next appointment, please call your pharmacy*  Lab Work: None   Testing/Procedures: None  Follow-Up: At [facility], you and your health needs are our priority.  As part of our continuing mission to provide you with exceptional heart care, we have created designated Provider Care Teams.  These Care Teams include your primary Cardiologist (physician) and Advanced Practice Providers (APPs -  Physician Assistants and Nurse Practitioners) who all work together to provide you with the care you need, when you need it.  Your next appointment:   1 year(s)  The format for your next appointment:   In Person  Provider:   Mark Skains, MD { 

## 2020-12-17 DIAGNOSIS — E785 Hyperlipidemia, unspecified: Secondary | ICD-10-CM | POA: Diagnosis not present

## 2020-12-19 DIAGNOSIS — R82998 Other abnormal findings in urine: Secondary | ICD-10-CM | POA: Diagnosis not present

## 2021-01-12 ENCOUNTER — Other Ambulatory Visit: Payer: Self-pay | Admitting: Internal Medicine

## 2021-04-29 DIAGNOSIS — N200 Calculus of kidney: Secondary | ICD-10-CM | POA: Diagnosis not present

## 2021-05-15 ENCOUNTER — Other Ambulatory Visit: Payer: Self-pay | Admitting: Internal Medicine

## 2021-05-15 DIAGNOSIS — Z1231 Encounter for screening mammogram for malignant neoplasm of breast: Secondary | ICD-10-CM

## 2021-05-27 DIAGNOSIS — R5383 Other fatigue: Secondary | ICD-10-CM | POA: Diagnosis not present

## 2021-05-27 DIAGNOSIS — R051 Acute cough: Secondary | ICD-10-CM | POA: Diagnosis not present

## 2021-05-27 DIAGNOSIS — R0981 Nasal congestion: Secondary | ICD-10-CM | POA: Diagnosis not present

## 2021-05-27 DIAGNOSIS — R062 Wheezing: Secondary | ICD-10-CM | POA: Diagnosis not present

## 2021-05-29 ENCOUNTER — Encounter: Payer: Self-pay | Admitting: Cardiology

## 2021-06-02 ENCOUNTER — Telehealth: Payer: Self-pay | Admitting: *Deleted

## 2021-06-02 DIAGNOSIS — I2510 Atherosclerotic heart disease of native coronary artery without angina pectoris: Secondary | ICD-10-CM

## 2021-06-02 DIAGNOSIS — I10 Essential (primary) hypertension: Secondary | ICD-10-CM

## 2021-06-02 DIAGNOSIS — Z8249 Family history of ischemic heart disease and other diseases of the circulatory system: Secondary | ICD-10-CM

## 2021-06-02 NOTE — Telephone Encounter (Signed)
Let's set her up for abdominal aortic ultrasound  -dx family history of AAA   Donato Schultz, MD   Order placed for AAA.  PT to be called to be scheduled.

## 2021-06-16 ENCOUNTER — Ambulatory Visit
Admission: RE | Admit: 2021-06-16 | Discharge: 2021-06-16 | Disposition: A | Discharge status: Home / Self Care | Payer: Medicare Other | Source: Ambulatory Visit

## 2021-06-16 ENCOUNTER — Ambulatory Visit (HOSPITAL_COMMUNITY)
Admission: RE | Admit: 2021-06-16 | Discharge: 2021-06-16 | Disposition: A | Discharge status: Home / Self Care | Payer: Medicare Other | Source: Ambulatory Visit | Attending: Cardiology | Admitting: Cardiology

## 2021-06-16 ENCOUNTER — Other Ambulatory Visit: Payer: Self-pay

## 2021-06-16 DIAGNOSIS — I10 Essential (primary) hypertension: Secondary | ICD-10-CM | POA: Insufficient documentation

## 2021-06-16 DIAGNOSIS — I2510 Atherosclerotic heart disease of native coronary artery without angina pectoris: Secondary | ICD-10-CM | POA: Diagnosis not present

## 2021-06-16 DIAGNOSIS — Z1231 Encounter for screening mammogram for malignant neoplasm of breast: Secondary | ICD-10-CM | POA: Diagnosis not present

## 2021-06-16 DIAGNOSIS — Z8249 Family history of ischemic heart disease and other diseases of the circulatory system: Secondary | ICD-10-CM | POA: Insufficient documentation

## 2021-06-16 DIAGNOSIS — Z8489 Family history of other specified conditions: Secondary | ICD-10-CM | POA: Diagnosis not present

## 2021-06-25 ENCOUNTER — Other Ambulatory Visit: Payer: Self-pay | Admitting: *Deleted

## 2021-06-30 ENCOUNTER — Other Ambulatory Visit: Payer: Self-pay | Admitting: *Deleted

## 2021-06-30 DIAGNOSIS — E785 Hyperlipidemia, unspecified: Secondary | ICD-10-CM

## 2021-06-30 DIAGNOSIS — I2510 Atherosclerotic heart disease of native coronary artery without angina pectoris: Secondary | ICD-10-CM

## 2021-06-30 DIAGNOSIS — Z8249 Family history of ischemic heart disease and other diseases of the circulatory system: Secondary | ICD-10-CM

## 2021-09-29 ENCOUNTER — Other Ambulatory Visit: Payer: Self-pay | Admitting: Physician Assistant

## 2021-10-13 ENCOUNTER — Other Ambulatory Visit: Payer: Self-pay

## 2021-10-13 ENCOUNTER — Telehealth: Payer: Self-pay | Admitting: Cardiology

## 2021-10-13 MED ORDER — EZETIMIBE 10 MG PO TABS: 10.0000 mg | ORAL_TABLET | Freq: Every day | ORAL | 0 refills | Status: DC

## 2021-10-13 NOTE — Telephone Encounter (Signed)
*  STAT* If patient is at the pharmacy, call can be transferred to refill team.   1. Which medications need to be refilled? (please list name of each medication and dose if known)  ezetimibe (ZETIA) 10 MG tablet  2. Which pharmacy/location (including street and city if local pharmacy) is medication to be sent to? [facility] - [place], [place] - [address]  3. Do they need a 30 day or 90 day supply?  90 day supply  Patient states she has a 1 week supply of medication remaining.

## 2021-10-14 ENCOUNTER — Other Ambulatory Visit: Payer: Self-pay | Admitting: Physician Assistant

## 2021-10-14 NOTE — Telephone Encounter (Signed)
Medication was sent to pt's pharmacy on 10/13/21, confirmation received.

## 2021-12-11 ENCOUNTER — Encounter: Payer: Self-pay | Admitting: Internal Medicine

## 2022-01-07 ENCOUNTER — Encounter: Payer: Self-pay | Admitting: Cardiology

## 2022-01-07 ENCOUNTER — Ambulatory Visit: Payer: Medicare Other | Admitting: Cardiology

## 2022-01-07 VITALS — BP 140/80 | HR 69 | Ht 66.0 in | Wt 154.0 lb

## 2022-01-07 DIAGNOSIS — I10 Essential (primary) hypertension: Secondary | ICD-10-CM

## 2022-01-07 DIAGNOSIS — Z79899 Other long term (current) drug therapy: Secondary | ICD-10-CM

## 2022-01-07 DIAGNOSIS — E785 Hyperlipidemia, unspecified: Secondary | ICD-10-CM | POA: Diagnosis not present

## 2022-01-07 DIAGNOSIS — I2510 Atherosclerotic heart disease of native coronary artery without angina pectoris: Secondary | ICD-10-CM | POA: Diagnosis not present

## 2022-01-07 MED ORDER — AMLODIPINE BESYLATE 5 MG PO TABS: 5.0000 mg | ORAL_TABLET | Freq: Every evening | ORAL | 3 refills | Status: AC

## 2022-01-07 MED ORDER — METOPROLOL SUCCINATE ER 50 MG PO TB24
ORAL_TABLET | ORAL | 3 refills | Status: AC
Start: 2022-01-07 — End: ?

## 2022-01-07 MED ORDER — ROSUVASTATIN CALCIUM 20 MG PO TABS: 20.0000 mg | ORAL_TABLET | Freq: Every day | ORAL | 3 refills | Status: AC

## 2022-01-07 MED ORDER — NITROGLYCERIN 0.4 MG SL SUBL: 0.4000 mg | SUBLINGUAL_TABLET | SUBLINGUAL | 3 refills | Status: DC | PRN

## 2022-01-07 MED ORDER — EZETIMIBE 10 MG PO TABS: 10.0000 mg | ORAL_TABLET | Freq: Every day | ORAL | 3 refills | Status: AC

## 2022-01-07 NOTE — Progress Notes (Unsigned)
Cardiology Office Note:    Date:  01/08/2022   ID:  Candice, Richardson 06/05/1945, MRN [id number]  PCP:  Garlan Fillers, MD   [facility] Providers Cardiologist:  Donato Schultz, MD     Referring MD: Garlan Fillers, MD    History of Present Illness:    Candice Richardson is a 76 y.o. female here for follow up CAD.  Hypertension, HLD,   Catheterization on 12/2019 with moderate three-vessel CAD with borderline decrease in FFR in the LAD.  Plan was to only perform PCI if symptoms were refractory to medical management.  Pump function was normal on echocardiogram with EF of 60%.  Walking 3 x week 30 min at time. A little SOB. No real CP.   AAA screening normal.   Past Medical History:  Diagnosis Date   Arthritis    Diverticulitis    GERD (gastroesophageal reflux disease)    Heart murmur    Hyperlipidemia    Hypertension    Torn meniscus     Past Surgical History:  Procedure Laterality Date   APPENDECTOMY  1947   78 months old   INTRAVASCULAR PRESSURE WIRE/FFR STUDY N/A 12/28/2019   Procedure: INTRAVASCULAR PRESSURE WIRE/FFR STUDY;  Surgeon: Yvonne Kendall, MD;  [facility]: [facility];  Service: Cardiovascular;  Laterality: N/A;  LAD, CFX RCA   KNEE ARTHROSCOPY W/ MENISCAL REPAIR Right 10/2018   LEFT HEART CATH AND CORONARY ANGIOGRAPHY N/A 12/28/2019   Procedure: LEFT HEART CATH AND CORONARY ANGIOGRAPHY;  Surgeon: Yvonne Kendall, MD;  [facility]: [facility];  Service: Cardiovascular;  Laterality: N/A;   TOTAL KNEE ARTHROPLASTY Left 05/08/2016   Procedure: LEFT TOTAL KNEE ARTHROPLASTY;  Surgeon: Eugenia Mcalpine, MD;  Location: [facility];  Service: Orthopedics;  Laterality: Left;   TOTAL KNEE ARTHROPLASTY Right 05/12/2019   Procedure: TOTAL KNEE ARTHROPLASTY;  Surgeon: Eugenia Mcalpine, MD;  Location: [facility];  Service: Orthopedics;  Laterality: Right;  adductor canal    Current Medications: Current Meds  Medication Sig   Ascorbic Acid (VITAMIN C) 1000 MG  tablet Take 1,000 mg by mouth daily.    aspirin EC 81 MG tablet Take 1 tablet (81 mg total) by mouth daily. Swallow whole.   Calcium Carbonate-Vitamin D (CALTRATE 600+D PO) Take 1 tablet by mouth daily.    Cholecalciferol (DIALYVITE VITAMIN D 5000 PO) Take 5,000 Units by mouth daily.    Coenzyme Q10 (CO Q-10) 100 MG CAPS Take 100 mg by mouth daily.   Multiple Vitamins-Minerals (MULTIVITAMIN WITH MINERALS) tablet Take 1 tablet by mouth daily.   omeprazole (PRILOSEC) 20 MG capsule Take 20 mg by mouth every evening.    vitamin B-12 (CYANOCOBALAMIN) 1000 MCG tablet Take 1,000 mcg by mouth daily.    [DISCONTINUED] amLODipine (NORVASC) 5 MG tablet Take 5 mg by mouth every evening.    [DISCONTINUED] ezetimibe (ZETIA) 10 MG tablet Take 1 tablet (10 mg total) by mouth daily.   [DISCONTINUED] metoprolol succinate (TOPROL-XL) 50 MG 24 hr tablet TAKE 1 TABLET BY MOUTH TWICE DAILY. TAKE WITH OR IMMEDIATELY FOLLOWING A MEAL   [DISCONTINUED] nitroGLYCERIN (NITROSTAT) 0.4 MG SL tablet Place 1 tablet (0.4 mg total) under the tongue every 5 (five) minutes as needed for chest pain.   [DISCONTINUED] rosuvastatin (CRESTOR) 20 MG tablet Take 20 mg by mouth daily.     Allergies:   Patient has no known allergies.   Social History   Socioeconomic History   Marital status: Widowed    Spouse name: Not on  file   Number of children: Not on file   Years of education: Not on file   Highest education level: Not on file  Occupational History   Not on file  Tobacco Use   Smoking status: Never   Smokeless tobacco: Never  Vaping Use   Vaping Use: Never used  Substance and Sexual Activity   Alcohol use: Yes    Alcohol/week: 9.0 standard drinks of alcohol    Types: 9 Glasses of wine per week   Drug use: No   Sexual activity: Not on file  Other Topics Concern   Not on file  Social History Narrative   Not on file   Social Determinants of Health   Financial Resource Strain: Not on file  Food Insecurity: Not on  file  Transportation Needs: Not on file  Physical Activity: Not on file  Stress: Not on file  Social Connections: Not on file     Family History: The patient's family history includes Colon cancer in her cousin; Colon polyps in her brother; Heart disease in her mother; Stomach cancer in her father. There is no history of Esophageal cancer, Rectal cancer, Pancreatic cancer, or Breast cancer.  ROS:   Please see the history of present illness.     All other systems reviewed and are negative.  EKGs/Labs/Other Studies Reviewed:    The following studies were reviewed today: Cardiac cath 12/28/2019  Diagnostic Dominance: Right  Conclusions: Moderate three-vessel coronary artery disease with diffuse medial calcification.  There are 40-60% stenoses involving the mid LAD, proximal LCx, OM2, and proximal RCA.  The sequential mid LAD stenoses are borderline hemodynamically significant (DFR 0.87).  LCx/OM2 and proximal RCA lesions are not hemodynamically significant. Hyperdynamic left ventricular systolic function with normal filling pressure.   Recommendations: Given atypical symptoms (left arm pain without chest pain or dyspnea) and borderline single-vessel disease involving a tortuous and heavily calcified LAD, I favor optimization of medical therapy and exploration of other potential causes of left shoulder pain.  I would reserve PCI attempt to the mid LAD for refractory symptoms.  We will add isosorbide mononitrate 15 mg daily to current regimen of amlodipine and metoprolol. Continue medical therapy to prevent progression of disease.  ECHO 2019: - Left ventricle: The cavity size was normal. Wall thickness was    normal. Systolic function was normal. The estimated ejection    fraction was in the range of 55% to 60%. Wall motion was normal;    there were no regional wall motion abnormalities.  - Pulmonary arteries: Systolic pressure was mildly increased. PA    peak pressure: 39 mm Hg (S).   AAA duplex 2023:  Abdominal Aorta: No evidence of an abdominal aortic aneurysm was  visualized. The largest aortic measurement is 1.8 cm.   Stenosis:  Widely patent bilateral common and external iliac arteries without  evidence of stenosis.   IVC/Iliac: Patent IVC.  EKG:  EKG is  ordered today.  The ekg ordered today demonstrates normal sinus rhythm 69  Recent Labs: 01/07/2022: ALT 20; BUN 14; Creatinine, Ser 0.70; Hemoglobin 14.5; Platelets 264; Potassium 4.5; Sodium 142  Recent Lipid Panel    Component Value Date/Time   CHOL 145 01/07/2022 1012   TRIG 224 (H) 01/07/2022 1012   HDL 48 01/07/2022 1012   CHOLHDL 3.0 01/07/2022 1012   LDLCALC 61 01/07/2022 1012     Risk Assessment/Calculations:              Physical Exam:    VS:  BP (!) 140/80 (BP Location: Left Arm, Patient Position: Sitting, Cuff Size: Normal)   Pulse 69   Ht 5\' 6"  (1.676 m)   Wt 154 lb (69.9 kg)   BMI 24.86 kg/m     Wt Readings from Last 3 Encounters:  01/07/22 154 lb (69.9 kg)  12/04/20 155 lb 9.6 oz (70.6 kg)  09/10/20 157 lb 3.2 oz (71.3 kg)     GEN:  Well nourished, well developed in no acute distress HEENT: Normal NECK: No JVD; No carotid bruits LYMPHATICS: No lymphadenopathy CARDIAC: RRR, no murmurs, no rubs, gallops RESPIRATORY:  Clear to auscultation without rales, wheezing or rhonchi  ABDOMEN: Soft, non-tender, non-distended MUSCULOSKELETAL:  No edema; No deformity  SKIN: Warm and dry NEUROLOGIC:  Alert and oriented x 3 PSYCHIATRIC:  Normal affect   ASSESSMENT:    1. Coronary artery disease involving native coronary artery of native heart without angina pectoris   2. Essential hypertension   3. Medication management   4. Hyperlipidemia, unspecified hyperlipidemia type    PLAN:    In order of problems listed above:  Coronary artery disease - As described above from cardiac catheterization report.  Continue with medical management.  If continue with aspirin amlodipine  statin.   Symptoms do become more worrisome, we could always consider PCI of the LAD. -Metoprolol was increased to 50 twice daily at prior visit. -Her symptoms can be baffling at times.  Sometimes she feels as though her left shoulder pain may be musculoskeletal versus anginal it is hard to tell.  The way she describes it it could certainly be musculoskeletal type of discomfort.  She is not describing any centralized chest pressure or pain.   Essential hypertension - Blood pressure has been fluctuating.  She had increased her metoprolol to 50 mg twice daily.  Doing better.  Checking her blood pressures at home.  Also on amlodipine 5 mg once a day for medical management.  No changes made.  Refills as needed.  Hyperlipidemia - On Crestor 20mg .  Zetia 10mg  (originallyhad some myalgias but better now).  Insurance would not pay for lipid clinic. Goal LDL < 70.   Her prior LDL was 86 prior to medication changes.   We will go and check lipids.  Check CBC check complete metabolic profile.       Medication Adjustments/Labs and Tests Ordered: Current medicines are reviewed at length with the patient today.  Concerns regarding medicines are outlined above.  Orders Placed This Encounter  Procedures   CBC   Comprehensive metabolic panel   Lipid panel   EKG 12-Lead   Meds ordered this encounter  Medications   rosuvastatin (CRESTOR) 20 MG tablet    Sig: Take 1 tablet (20 mg total) by mouth daily.    Dispense:  90 tablet    Refill:  3   nitroGLYCERIN (NITROSTAT) 0.4 MG SL tablet    Sig: Place 1 tablet (0.4 mg total) under the tongue every 5 (five) minutes as needed for chest pain.    Dispense:  25 tablet    Refill:  3   metoprolol succinate (TOPROL-XL) 50 MG 24 hr tablet    Sig: TAKE 1 TABLET BY MOUTH TWICE DAILY. TAKE WITH OR IMMEDIATELY FOLLOWING A MEAL    Dispense:  180 tablet    Refill:  3   ezetimibe (ZETIA) 10 MG tablet    Sig: Take 1 tablet (10 mg total) by mouth daily.    Dispense:   90 tablet  Refill:  3   amLODipine (NORVASC) 5 MG tablet    Sig: Take 1 tablet (5 mg total) by mouth every evening.    Dispense:  90 tablet    Refill:  3    Patient Instructions  Medication Instructions:  The current medical regimen is effective;  continue present plan and medications.  *If you need a refill on your cardiac medications before your next appointment, please call your pharmacy*   Lab Work: Please have blood work today (CBC. CMP and Lipid)  If you have labs (blood work) drawn today and your tests are completely normal, you will receive your results only by: MyChart Message (if you have MyChart) OR A paper copy in the mail If you have any lab test that is abnormal or we need to change your treatment, we will call you to review the results.  Follow-Up: At [facility], you and your health needs are our priority.  As part of our continuing mission to provide you with exceptional heart care, we have created designated Provider Care Teams.  These Care Teams include your primary Cardiologist (physician) and Advanced Practice Providers (APPs -  Physician Assistants and Nurse Practitioners) who all work together to provide you with the care you need, when you need it.  We recommend signing up for the patient portal called "MyChart".  Sign up information is provided on this After Visit Summary.  MyChart is used to connect with patients for Virtual Visits (Telemedicine).  Patients are able to view lab/test results, encounter notes, upcoming appointments, etc.  Non-urgent messages can be sent to your provider as well.   To learn more about what you can do with MyChart, go to [email or web address].    Your next appointment:   1 year(s)  The format for your next appointment:   In Person  Provider:   Donato Schultz, MD {   Important Information About Sugar         Signed, Donato Schultz, MD  01/08/2022 8:44 AM    [facility]

## 2022-01-07 NOTE — Patient Instructions (Signed)
Medication Instructions:  The current medical regimen is effective;  continue present plan and medications.  *If you need a refill on your cardiac medications before your next appointment, please call your pharmacy*   Lab Work: Please have blood work today (CBC. CMP and Lipid)  If you have labs (blood work) drawn today and your tests are completely normal, you will receive your results only by: MyChart Message (if you have MyChart) OR A paper copy in the mail If you have any lab test that is abnormal or we need to change your treatment, we will call you to review the results.  Follow-Up: At [facility], you and your health needs are our priority.  As part of our continuing mission to provide you with exceptional heart care, we have created designated Provider Care Teams.  These Care Teams include your primary Cardiologist (physician) and Advanced Practice Providers (APPs -  Physician Assistants and Nurse Practitioners) who all work together to provide you with the care you need, when you need it.  We recommend signing up for the patient portal called "MyChart".  Sign up information is provided on this After Visit Summary.  MyChart is used to connect with patients for Virtual Visits (Telemedicine).  Patients are able to view lab/test results, encounter notes, upcoming appointments, etc.  Non-urgent messages can be sent to your provider as well.   To learn more about what you can do with MyChart, go to [email or web address].    Your next appointment:   1 year(s)  The format for your next appointment:   In Person  Provider:   Donato Schultz, MD {   Important Information About Sugar

## 2022-01-08 LAB — CBC
Hematocrit: 43.2 % (ref 34.0–46.6)
Hemoglobin: 14.5 g/dL (ref 11.1–15.9)
MCH: 33 pg (ref 26.6–33.0)
MCHC: 33.6 g/dL (ref 31.5–35.7)
MCV: 98 fL — ABNORMAL HIGH (ref 79–97)
Platelets: 264 10*3/uL (ref 150–450)
RBC: 4.39 x10E6/uL (ref 3.77–5.28)
RDW: 11.8 % (ref 11.7–15.4)
WBC: 6.8 10*3/uL (ref 3.4–10.8)

## 2022-01-08 LAB — COMPREHENSIVE METABOLIC PANEL
ALT: 20 IU/L (ref 0–32)
AST: 21 IU/L (ref 0–40)
Albumin/Globulin Ratio: 2 (ref 1.2–2.2)
Albumin: 4.6 g/dL (ref 3.8–4.8)
Alkaline Phosphatase: 78 IU/L (ref 44–121)
BUN/Creatinine Ratio: 20 (ref 12–28)
BUN: 14 mg/dL (ref 8–27)
Bilirubin Total: 1 mg/dL (ref 0.0–1.2)
CO2: 27 mmol/L (ref 20–29)
Calcium: 9.7 mg/dL (ref 8.7–10.3)
Chloride: 102 mmol/L (ref 96–106)
Creatinine, Ser: 0.7 mg/dL (ref 0.57–1.00)
Globulin, Total: 2.3 g/dL (ref 1.5–4.5)
Glucose: 81 mg/dL (ref 70–99)
Potassium: 4.5 mmol/L (ref 3.5–5.2)
Sodium: 142 mmol/L (ref 134–144)
Total Protein: 6.9 g/dL (ref 6.0–8.5)
eGFR: 88 mL/min/{1.73_m2} (ref >59)

## 2022-01-08 LAB — LIPID PANEL
Chol/HDL Ratio: 3 ratio (ref 0.0–4.4)
Cholesterol, Total: 145 mg/dL (ref 100–199)
HDL: 48 mg/dL (ref >39)
LDL Chol Calc (NIH): 61 mg/dL (ref 0–99)
Triglycerides: 224 mg/dL — ABNORMAL HIGH (ref 0–149)
VLDL Cholesterol Cal: 36 mg/dL (ref 5–40)

## 2022-03-06 DIAGNOSIS — Z789 Other specified health status: Secondary | ICD-10-CM | POA: Diagnosis not present

## 2022-03-06 DIAGNOSIS — R1032 Left lower quadrant pain: Secondary | ICD-10-CM | POA: Diagnosis not present

## 2022-03-06 DIAGNOSIS — R399 Unspecified symptoms and signs involving the genitourinary system: Secondary | ICD-10-CM | POA: Diagnosis not present

## 2022-03-06 DIAGNOSIS — K5730 Diverticulosis of large intestine without perforation or abscess without bleeding: Secondary | ICD-10-CM | POA: Diagnosis not present

## 2022-03-06 DIAGNOSIS — E785 Hyperlipidemia, unspecified: Secondary | ICD-10-CM | POA: Diagnosis not present

## 2022-03-06 DIAGNOSIS — N200 Calculus of kidney: Secondary | ICD-10-CM | POA: Diagnosis not present

## 2022-03-06 DIAGNOSIS — Z634 Disappearance and death of family member: Secondary | ICD-10-CM | POA: Diagnosis not present

## 2022-03-06 DIAGNOSIS — I10 Essential (primary) hypertension: Secondary | ICD-10-CM | POA: Diagnosis not present

## 2022-03-10 DIAGNOSIS — K5792 Diverticulitis of intestine, part unspecified, without perforation or abscess without bleeding: Secondary | ICD-10-CM | POA: Diagnosis not present

## 2022-03-10 DIAGNOSIS — R1032 Left lower quadrant pain: Secondary | ICD-10-CM | POA: Diagnosis not present

## 2022-04-15 DIAGNOSIS — I25118 Atherosclerotic heart disease of native coronary artery with other forms of angina pectoris: Secondary | ICD-10-CM | POA: Diagnosis not present

## 2022-04-15 DIAGNOSIS — Z780 Asymptomatic menopausal state: Secondary | ICD-10-CM | POA: Diagnosis not present

## 2022-04-15 DIAGNOSIS — E785 Hyperlipidemia, unspecified: Secondary | ICD-10-CM | POA: Diagnosis not present

## 2022-04-15 DIAGNOSIS — I10 Essential (primary) hypertension: Secondary | ICD-10-CM | POA: Diagnosis not present

## 2022-05-05 DIAGNOSIS — K640 First degree hemorrhoids: Secondary | ICD-10-CM | POA: Diagnosis not present

## 2022-05-05 DIAGNOSIS — Z1211 Encounter for screening for malignant neoplasm of colon: Secondary | ICD-10-CM | POA: Diagnosis not present

## 2022-05-05 DIAGNOSIS — D122 Benign neoplasm of ascending colon: Secondary | ICD-10-CM | POA: Diagnosis not present

## 2022-05-05 DIAGNOSIS — K635 Polyp of colon: Secondary | ICD-10-CM | POA: Diagnosis not present

## 2022-05-05 DIAGNOSIS — K5730 Diverticulosis of large intestine without perforation or abscess without bleeding: Secondary | ICD-10-CM | POA: Diagnosis not present

## 2022-05-05 DIAGNOSIS — Z86010 Personal history of colonic polyps: Secondary | ICD-10-CM | POA: Diagnosis not present

## 2022-06-15 DIAGNOSIS — J101 Influenza due to other identified influenza virus with other respiratory manifestations: Secondary | ICD-10-CM | POA: Diagnosis not present

## 2022-06-15 DIAGNOSIS — Z20822 Contact with and (suspected) exposure to covid-19: Secondary | ICD-10-CM | POA: Diagnosis not present

## 2022-06-15 DIAGNOSIS — J069 Acute upper respiratory infection, unspecified: Secondary | ICD-10-CM | POA: Diagnosis not present

## 2022-06-22 DIAGNOSIS — M8589 Other specified disorders of bone density and structure, multiple sites: Secondary | ICD-10-CM | POA: Diagnosis not present

## 2022-06-22 DIAGNOSIS — H6992 Unspecified Eustachian tube disorder, left ear: Secondary | ICD-10-CM | POA: Diagnosis not present

## 2022-07-13 DIAGNOSIS — J069 Acute upper respiratory infection, unspecified: Secondary | ICD-10-CM | POA: Diagnosis not present

## 2022-07-13 DIAGNOSIS — J029 Acute pharyngitis, unspecified: Secondary | ICD-10-CM | POA: Diagnosis not present

## 2022-07-21 DIAGNOSIS — I10 Essential (primary) hypertension: Secondary | ICD-10-CM | POA: Diagnosis not present

## 2022-09-14 DIAGNOSIS — Z1231 Encounter for screening mammogram for malignant neoplasm of breast: Secondary | ICD-10-CM | POA: Diagnosis not present

## 2022-09-29 DIAGNOSIS — I4891 Unspecified atrial fibrillation: Secondary | ICD-10-CM | POA: Diagnosis not present

## 2022-09-29 DIAGNOSIS — I10 Essential (primary) hypertension: Secondary | ICD-10-CM | POA: Diagnosis not present

## 2022-09-29 DIAGNOSIS — R42 Dizziness and giddiness: Secondary | ICD-10-CM | POA: Diagnosis not present

## 2022-09-29 DIAGNOSIS — E785 Hyperlipidemia, unspecified: Secondary | ICD-10-CM | POA: Diagnosis not present

## 2022-09-29 DIAGNOSIS — R079 Chest pain, unspecified: Secondary | ICD-10-CM | POA: Diagnosis not present

## 2022-09-29 DIAGNOSIS — I482 Chronic atrial fibrillation, unspecified: Secondary | ICD-10-CM | POA: Diagnosis not present

## 2022-09-29 DIAGNOSIS — R0789 Other chest pain: Secondary | ICD-10-CM | POA: Diagnosis not present

## 2022-10-01 DIAGNOSIS — I25118 Atherosclerotic heart disease of native coronary artery with other forms of angina pectoris: Secondary | ICD-10-CM | POA: Diagnosis not present

## 2022-10-01 DIAGNOSIS — I4891 Unspecified atrial fibrillation: Secondary | ICD-10-CM | POA: Diagnosis not present

## 2022-10-01 DIAGNOSIS — I10 Essential (primary) hypertension: Secondary | ICD-10-CM | POA: Diagnosis not present

## 2022-10-01 DIAGNOSIS — R0789 Other chest pain: Secondary | ICD-10-CM | POA: Diagnosis not present

## 2022-10-05 DIAGNOSIS — I4891 Unspecified atrial fibrillation: Secondary | ICD-10-CM | POA: Diagnosis not present

## 2022-10-05 DIAGNOSIS — R0789 Other chest pain: Secondary | ICD-10-CM | POA: Diagnosis not present

## 2022-10-15 DIAGNOSIS — I4891 Unspecified atrial fibrillation: Secondary | ICD-10-CM | POA: Diagnosis not present

## 2022-11-12 DIAGNOSIS — I340 Nonrheumatic mitral (valve) insufficiency: Secondary | ICD-10-CM | POA: Diagnosis not present

## 2022-11-12 DIAGNOSIS — I361 Nonrheumatic tricuspid (valve) insufficiency: Secondary | ICD-10-CM | POA: Diagnosis not present

## 2022-11-12 DIAGNOSIS — I517 Cardiomegaly: Secondary | ICD-10-CM | POA: Diagnosis not present

## 2022-11-20 DIAGNOSIS — I25118 Atherosclerotic heart disease of native coronary artery with other forms of angina pectoris: Secondary | ICD-10-CM | POA: Diagnosis not present

## 2022-11-20 DIAGNOSIS — I119 Hypertensive heart disease without heart failure: Secondary | ICD-10-CM | POA: Diagnosis not present

## 2022-11-20 DIAGNOSIS — E785 Hyperlipidemia, unspecified: Secondary | ICD-10-CM | POA: Diagnosis not present

## 2022-11-20 DIAGNOSIS — I10 Essential (primary) hypertension: Secondary | ICD-10-CM | POA: Diagnosis not present

## 2022-12-01 DIAGNOSIS — E785 Hyperlipidemia, unspecified: Secondary | ICD-10-CM | POA: Diagnosis not present

## 2022-12-01 DIAGNOSIS — I10 Essential (primary) hypertension: Secondary | ICD-10-CM | POA: Diagnosis not present

## 2022-12-01 DIAGNOSIS — I119 Hypertensive heart disease without heart failure: Secondary | ICD-10-CM | POA: Diagnosis not present

## 2022-12-04 DIAGNOSIS — I10 Essential (primary) hypertension: Secondary | ICD-10-CM | POA: Diagnosis not present

## 2022-12-04 DIAGNOSIS — I25118 Atherosclerotic heart disease of native coronary artery with other forms of angina pectoris: Secondary | ICD-10-CM | POA: Diagnosis not present

## 2022-12-04 DIAGNOSIS — I700 Atherosclerosis of aorta: Secondary | ICD-10-CM | POA: Diagnosis not present

## 2022-12-04 DIAGNOSIS — M546 Pain in thoracic spine: Secondary | ICD-10-CM | POA: Diagnosis not present

## 2022-12-04 DIAGNOSIS — M47814 Spondylosis without myelopathy or radiculopathy, thoracic region: Secondary | ICD-10-CM | POA: Diagnosis not present

## 2022-12-04 DIAGNOSIS — Z0000 Encounter for general adult medical examination without abnormal findings: Secondary | ICD-10-CM | POA: Diagnosis not present

## 2022-12-09 DIAGNOSIS — R69 Illness, unspecified: Secondary | ICD-10-CM | POA: Diagnosis not present

## 2023-01-14 DIAGNOSIS — U071 COVID-19: Secondary | ICD-10-CM | POA: Diagnosis not present

## 2023-04-27 DIAGNOSIS — I480 Paroxysmal atrial fibrillation: Secondary | ICD-10-CM | POA: Diagnosis not present

## 2023-05-05 DIAGNOSIS — M1612 Unilateral primary osteoarthritis, left hip: Secondary | ICD-10-CM | POA: Diagnosis not present

## 2023-05-05 DIAGNOSIS — M7062 Trochanteric bursitis, left hip: Secondary | ICD-10-CM | POA: Diagnosis not present

## 2023-05-14 DIAGNOSIS — I340 Nonrheumatic mitral (valve) insufficiency: Secondary | ICD-10-CM | POA: Diagnosis not present

## 2023-05-14 DIAGNOSIS — I10 Essential (primary) hypertension: Secondary | ICD-10-CM | POA: Diagnosis not present

## 2023-05-14 DIAGNOSIS — E785 Hyperlipidemia, unspecified: Secondary | ICD-10-CM | POA: Diagnosis not present

## 2023-05-14 DIAGNOSIS — I25118 Atherosclerotic heart disease of native coronary artery with other forms of angina pectoris: Secondary | ICD-10-CM | POA: Diagnosis not present

## 2023-08-06 ENCOUNTER — Other Ambulatory Visit: Payer: Self-pay

## 2023-08-06 MED ORDER — NITROGLYCERIN 0.4 MG SL SUBL
0.4000 mg | SUBLINGUAL_TABLET | SUBLINGUAL | 0 refills | Status: AC | PRN
Start: 2023-08-06 — End: ?
